# Patient Record
Sex: Female | Born: 1940
Health system: Southern US, Community
[De-identification: ages and names within clinical notes are randomized; demographics above are authoritative.]

## PROBLEM LIST (undated history)

## (undated) DIAGNOSIS — N183 Chronic kidney disease, stage 3 unspecified: Secondary | ICD-10-CM

## (undated) DIAGNOSIS — E1121 Type 2 diabetes mellitus with diabetic nephropathy: Secondary | ICD-10-CM

## (undated) DIAGNOSIS — M810 Age-related osteoporosis without current pathological fracture: Secondary | ICD-10-CM

## (undated) DIAGNOSIS — D249 Benign neoplasm of unspecified breast: Secondary | ICD-10-CM

## (undated) DIAGNOSIS — K219 Gastro-esophageal reflux disease without esophagitis: Secondary | ICD-10-CM

## (undated) DIAGNOSIS — I10 Essential (primary) hypertension: Secondary | ICD-10-CM

## (undated) DIAGNOSIS — I4891 Unspecified atrial fibrillation: Secondary | ICD-10-CM

## (undated) DIAGNOSIS — E785 Hyperlipidemia, unspecified: Secondary | ICD-10-CM

## (undated) DIAGNOSIS — M1990 Unspecified osteoarthritis, unspecified site: Secondary | ICD-10-CM

## (undated) DIAGNOSIS — E119 Type 2 diabetes mellitus without complications: Secondary | ICD-10-CM

## (undated) HISTORY — DX: Gastro-esophageal reflux disease without esophagitis: K21.9

## (undated) HISTORY — DX: Age-related osteoporosis without current pathological fracture: M81.0

## (undated) HISTORY — DX: Chronic kidney disease, stage 3 unspecified: N18.30

## (undated) HISTORY — PX: BREAST SURGERY: SHX581

## (undated) HISTORY — PX: COLONOSCOPY: SHX174

## (undated) HISTORY — DX: Type 2 diabetes mellitus with diabetic nephropathy: E11.21

## (undated) HISTORY — PX: VARICOSE VEIN SURGERY: SHX832

## (undated) HISTORY — PX: ABDOMINAL HYSTERECTOMY: SHX81

## (undated) HISTORY — DX: Unspecified atrial fibrillation: I48.91

## (undated) HISTORY — PX: APPENDECTOMY: SHX54

---

## 2002-02-17 ENCOUNTER — Inpatient Hospital Stay (HOSPITAL_COMMUNITY): Admission: AD | Admit: 2002-02-17 | Discharge: 2002-02-22 | Payer: Self-pay | Admitting: Cardiology

## 2002-02-18 ENCOUNTER — Encounter: Payer: Self-pay | Admitting: Cardiology

## 2002-02-22 ENCOUNTER — Encounter: Payer: Self-pay | Admitting: Cardiology

## 2016-04-01 ENCOUNTER — Other Ambulatory Visit: Payer: Self-pay | Admitting: Internal Medicine

## 2016-04-01 DIAGNOSIS — R9389 Abnormal findings on diagnostic imaging of other specified body structures: Secondary | ICD-10-CM

## 2016-04-01 DIAGNOSIS — N63 Unspecified lump in unspecified breast: Secondary | ICD-10-CM

## 2016-04-07 ENCOUNTER — Ambulatory Visit
Admission: RE | Admit: 2016-04-07 | Discharge: 2016-04-07 | Disposition: A | Discharge status: Home / Self Care | Payer: Medicare Other | Source: Ambulatory Visit | Attending: Internal Medicine | Admitting: Internal Medicine

## 2016-04-07 ENCOUNTER — Ambulatory Visit
Admission: RE | Admit: 2016-04-07 | Discharge: 2016-04-07 | Disposition: A | Discharge status: Home / Self Care | Payer: Self-pay | Source: Ambulatory Visit | Attending: Internal Medicine | Admitting: Internal Medicine

## 2016-04-07 DIAGNOSIS — R9389 Abnormal findings on diagnostic imaging of other specified body structures: Secondary | ICD-10-CM

## 2016-04-07 DIAGNOSIS — N63 Unspecified lump in unspecified breast: Secondary | ICD-10-CM

## 2016-04-07 MED ORDER — GADOBENATE DIMEGLUMINE 529 MG/ML IV SOLN
20.0000 mL | Freq: Once | INTRAVENOUS | Status: AC | PRN
  Administered 2016-04-07: 20 mL via INTRAVENOUS

## 2016-04-17 ENCOUNTER — Ambulatory Visit: Payer: Self-pay | Admitting: Surgery

## 2016-04-17 DIAGNOSIS — D242 Benign neoplasm of left breast: Secondary | ICD-10-CM

## 2016-04-17 NOTE — H&P (Signed)
une C. Guppy 04/17/2016 3:22 PM Location: [facility] Patient #: [id number] DOB: Nov 15, 1941 Married / Language: English / Race: White Female  History of Present Illness Marcello Moores A. Saleena Tamas MD; 04/17/2016 3:43 PM) Patient words: Patient sent at the request of Dr. Shon Hale of the [facility] for a left breast mammographic abnormality and right breast nipple discharge. The patient has experienced right breast nipple discharge for the last 3 months. It is nonbloody in nature. Mammogram and ultrasound were done which shows dilated ducts on the right and a mammographic abnormality on the left. Core biopsy of the left breast revealed papilloma. Other findings are that of fibrocystic change. No history of breast cancer. She is a previous left breast biopsies as a teenager. She has chronically inverted nipple she states.                           ADDENDUM REPORT: 04/08/2016 15:23 PATHOLOGY: Right breast: BENIGN DILATED DUCTS (ECTATIC DUCTS) WITH FOCAL CALCIFICATION, FIBROCYSTIC CHANGES WITH USUAL DUCTAL HYPERPLASIA of the lower inner, retroareolar area of the Right breast, with excision recommended. Left breast: INTRADUCTAL PAPILLOMA FORMATION WITH USUAL DUCTAL HYPERPLASIA, FIBROCYSTIC CHANGES WITH USUAL DUCTAL HYPERPLASIA AND ASSOCIATED CALCIFICATIONS of the central Left breast, with excision recommended. Findings at both sites are concordant per Dr. Nolon Nations. Pathology results were discussed with the patient by telephone. The patient reported doing well after the biopsies with tenderness at the sites. Post biopsy instructions and care were reviewed and questions were answered. The patient was encouraged to call [facility] for any additional concerns. At the request of the patient, surgical consultation has been arranged with Dr. Erroll Luna at [facility] on Apr 17, 2016. Pathology results reported by  Terie Purser, RN on 04/08/2016. Electronically Signed By: Nolon Nations M.D. On: 04/08/2016 15:23           Diagnosis 1. Breast, right, needle core biopsy, lower inner retroareolar - BENIGN DILATED DUCTS (ECTATIC DUCTS) WITH FOCAL CALCIFICATION. - FIBROCYSTIC CHANGES WITH USUAL DUCTAL HYPERPLASIA. - NO ATYPIA OR MALIGNANCY IDENTIFIED. - SEE COMMENT. 2. Breast, left, needle core biopsy, central - INTRADUCTAL PAPILLOMA FORMATION WITH USUAL DUCTAL HYPERPLASIA. - FIBROCYSTIC CHANGES WITH USUAL DUCTAL HYPERPLASIA AND ASSOCIATED CALCIFICATIONS. - SEE COMMENT.  The patient is a 75 year old female.   Other Problems Elbert Ewings, CMA; 04/17/2016 3:22 PM) Arthritis Back Pain Diabetes Mellitus Gastric Ulcer Gastroesophageal Reflux Disease High blood pressure Hypercholesterolemia Oophorectomy  Past Surgical History Elbert Ewings, CMA; 04/17/2016 3:22 PM) Appendectomy Breast Biopsy Left. multiple Hysterectomy (not due to cancer) - Partial  Diagnostic Studies History Elbert Ewings, CMA; 04/17/2016 3:22 PM) Colonoscopy 1-5 years ago Mammogram within last year Pap Smear >5 years ago  Allergies Elbert Ewings, CMA; 04/17/2016 3:23 PM) No Known Drug Allergies 04/17/2016  Medication History Elbert Ewings, CMA; 04/17/2016 3:24 PM) Meloxicam (15MG  Tablet, Oral twice a week) Active. Pioglitazone HCl (30MG  Tablet, Oral) Active. Omeprazole (40MG  Capsule DR, Oral) Active. Metoprolol Tartrate (50MG  Tablet, Oral) Active. Lisinopril (10MG  Tablet, Oral) Active. Pravastatin Sodium (40MG  Tablet, Oral) Active. TraMADol HCl (50MG  Tablet, Oral) Active. Zolpidem Tartrate (5MG  Tablet, Oral) Active. ALPRAZolam (0.5MG  Tablet, Oral) Active. Medications Reconciled  Social History Elbert Ewings, [place]; 04/17/2016 3:22 PM) No alcohol use No drug use Tobacco use Never smoker.  Family History Elbert Ewings, [place]; 04/17/2016 3:22 PM) Bleeding disorder Mother,  Sister. Cerebrovascular Accident Mother. Colon Cancer Mother, Sister. Colon Polyps Brother. Diabetes Mellitus Brother,  Sister. Heart Disease Brother, Mother, Sister. Hypertension Brother, Mother, Sister. Malignant Neoplasm Of Pancreas Sister.  Pregnancy / Birth History Elbert Ewings, CMA; 04/17/2016 3:22 PM) Age at menarche 83 years. Age of menopause 51-55 Contraceptive History Oral contraceptives. Gravida 2 Maternal age 75-20 Para 2 Regular periods     Review of Systems Elbert Ewings CMA; 04/17/2016 3:22 PM) General Not Present- Appetite Loss, Chills, Fatigue, Fever, Night Sweats, Weight Gain and Weight Loss. Skin Not Present- Change in Wart/Mole, Dryness, Hives, Jaundice, New Lesions, Non-Healing Wounds, Rash and Ulcer. HEENT Present- Wears glasses/contact lenses. Not Present- Earache, Hearing Loss, Hoarseness, Nose Bleed, Oral Ulcers, Ringing in the Ears, Seasonal Allergies, Sinus Pain, Sore Throat, Visual Disturbances and Yellow Eyes. Respiratory Present- Snoring. Not Present- Bloody sputum, Chronic Cough, Difficulty Breathing and Wheezing. Breast Present- Nipple Discharge. Not Present- Breast Mass, Breast Pain and Skin Changes. Cardiovascular Not Present- Chest Pain, Difficulty Breathing Lying Down, Leg Cramps, Palpitations, Rapid Heart Rate, Shortness of Breath and Swelling of Extremities. Gastrointestinal Not Present- Abdominal Pain, Bloating, Bloody Stool, Change in Bowel Habits, Chronic diarrhea, Constipation, Difficulty Swallowing, Excessive gas, Gets full quickly at meals, Hemorrhoids, Indigestion, Nausea, Rectal Pain and Vomiting. Female Genitourinary Not Present- Frequency, Nocturia, Painful Urination, Pelvic Pain and Urgency. Musculoskeletal Present- Back Pain and Joint Stiffness. Not Present- Joint Pain, Muscle Pain, Muscle Weakness and Swelling of Extremities. Psychiatric Not Present- Anxiety, Bipolar, Change in Sleep Pattern, Depression, Fearful and Frequent  crying. Hematology Not Present- Easy Bruising, Excessive bleeding, Gland problems, HIV and Persistent Infections.  Vitals Elbert Ewings CMA; 04/17/2016 3:24 PM) 04/17/2016 3:24 PM Weight: 213 lb Height: 66.5in Body Surface Area: 2.07 m Body Mass Index: 33.86 kg/m  Temp.: 97.32F  Pulse: 74 (Regular)  BP: 126/74 (Sitting, Left Arm, Standard)      Physical Exam (Neveyah Garzon A. Kaaliyah Kita MD; 04/17/2016 3:43 PM)  General Mental Status-Alert. General Appearance-Consistent with stated age. Hydration-Well hydrated. Voice-Normal.  Chest and Lung Exam Chest and lung exam reveals -quiet, even and easy respiratory effort with no use of accessory muscles and on auscultation, normal breath sounds, no adventitious sounds and normal vocal resonance. Inspection Chest Wall - Normal. Back - normal.  Breast Note: Right breast shows clear nipple discharge from dominant duct. There is no left nipple discharge. Post biopsy changes noted. No palpable masses in either breast.  Cardiovascular Cardiovascular examination reveals -normal heart sounds, regular rate and rhythm with no murmurs and normal pedal pulses bilaterally.  Neurologic Neurologic evaluation reveals -alert and oriented x 3 with no impairment of recent or remote memory. Mental Status-Normal.  Musculoskeletal Normal Exam - Left-Upper Extremity Strength Normal and Lower Extremity Strength Normal. Normal Exam - Right-Upper Extremity Strength Normal and Lower Extremity Strength Normal.  Lymphatic Head & Neck  General Head & Neck Lymphatics: Bilateral - Description - Normal. Axillary  General Axillary Region: Bilateral - Description - Normal. Tenderness - Non Tender.    Assessment & Plan (Anesa Fronek A. 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  Risk of lumpectomy include bleeding, infection, seroma, more surgery, use of seed/wire, wound care, cosmetic deformity and the need for other treatments, death , blood clots, death. Pt agrees to proceed.  DISCHARGE FROM RIGHT NIPPLE [phone]) Impression: Patient desires treatment of the right nipple discharge. Recommend right breast central duct excision. Risk of lumpectomy include bleeding, infection, seroma, more surgery, use of seed/wire, wound care, cosmetic  deformity nipple loss and the need for other treatments, death , blood clots, death. Pt agrees to proceed.  Current Plans You are being scheduled for surgery - Our schedulers will call you.  You should hear from our office's scheduling department within 5 working days about the location, date, and time of surgery. We try to make accommodations for patient's preferences in scheduling surgery, but sometimes the OR schedule or the surgeon's schedule prevents [place] from making those accommodations.  If you have not heard from our office [phone]) in 5 working days, call the office and ask for your surgeon's nurse.  If you have other questions about your diagnosis, plan, or surgery, call the office and ask for your surgeon's nurse.  Pt Education - Pamphlet Given - Breast Biopsy: discussed with patient and provided information. Pt Education - [facility] Breast Biopsy HCI: discussed with patient and provided information. The anatomy and the physiology was discussed. The pathophysiology and natural history of the disease was discussed. Options were discussed and recommendations were made. Technique, risks, benefits, & alternatives were discussed. Risks such as stroke, heart attack, bleeding, indection, death, and other risks discussed. Questions answered. The patient agrees to proceed.

## 2016-04-24 ENCOUNTER — Other Ambulatory Visit: Payer: Self-pay | Admitting: Surgery

## 2016-04-24 DIAGNOSIS — D242 Benign neoplasm of left breast: Secondary | ICD-10-CM

## 2016-04-25 ENCOUNTER — Encounter (HOSPITAL_BASED_OUTPATIENT_CLINIC_OR_DEPARTMENT_OTHER): Payer: Self-pay | Admitting: *Deleted

## 2016-04-28 ENCOUNTER — Encounter (HOSPITAL_BASED_OUTPATIENT_CLINIC_OR_DEPARTMENT_OTHER)
Admission: RE | Admit: 2016-04-28 | Discharge: 2016-04-28 | Disposition: A | Discharge status: Home / Self Care | Payer: Medicare Other | Source: Ambulatory Visit | Attending: Surgery | Admitting: Surgery

## 2016-04-28 ENCOUNTER — Other Ambulatory Visit: Payer: Self-pay

## 2016-04-28 DIAGNOSIS — N6011 Diffuse cystic mastopathy of right breast: Secondary | ICD-10-CM | POA: Diagnosis not present

## 2016-04-28 DIAGNOSIS — M1990 Unspecified osteoarthritis, unspecified site: Secondary | ICD-10-CM | POA: Diagnosis not present

## 2016-04-28 DIAGNOSIS — Z79899 Other long term (current) drug therapy: Secondary | ICD-10-CM | POA: Diagnosis not present

## 2016-04-28 DIAGNOSIS — N6012 Diffuse cystic mastopathy of left breast: Secondary | ICD-10-CM | POA: Diagnosis not present

## 2016-04-28 DIAGNOSIS — N6092 Unspecified benign mammary dysplasia of left breast: Secondary | ICD-10-CM | POA: Diagnosis not present

## 2016-04-28 DIAGNOSIS — E7800 Pure hypercholesterolemia, unspecified: Secondary | ICD-10-CM | POA: Diagnosis not present

## 2016-04-28 DIAGNOSIS — K219 Gastro-esophageal reflux disease without esophagitis: Secondary | ICD-10-CM | POA: Diagnosis not present

## 2016-04-28 DIAGNOSIS — I10 Essential (primary) hypertension: Secondary | ICD-10-CM | POA: Diagnosis not present

## 2016-04-28 DIAGNOSIS — D242 Benign neoplasm of left breast: Secondary | ICD-10-CM | POA: Diagnosis not present

## 2016-04-28 DIAGNOSIS — L988 Other specified disorders of the skin and subcutaneous tissue: Secondary | ICD-10-CM | POA: Diagnosis present

## 2016-04-28 DIAGNOSIS — E119 Type 2 diabetes mellitus without complications: Secondary | ICD-10-CM | POA: Diagnosis not present

## 2016-04-28 LAB — BASIC METABOLIC PANEL
ANION GAP: 5 (ref 5–15)
BUN: 13 mg/dL (ref 6–20)
CHLORIDE: 99 mmol/L — AB (ref 101–111)
CO2: 29 mmol/L (ref 22–32)
CREATININE: 0.79 mg/dL (ref 0.44–1.00)
Calcium: 9.5 mg/dL (ref 8.9–10.3)
GFR calc non Af Amer: >60 mL/min (ref >60)
Glucose, Bld: 201 mg/dL — ABNORMAL HIGH (ref 65–99)
POTASSIUM: 4.9 mmol/L (ref 3.5–5.1)
SODIUM: 133 mmol/L — AB (ref 135–145)

## 2016-04-28 NOTE — Progress Notes (Signed)
Pt given 8 oz bottle of water to drink by 9 am day of surgery. Instructed this is the only fluid to have after midnight. Pt given written instruction to same.  Pt voiced understanding

## 2016-04-29 ENCOUNTER — Ambulatory Visit
Admission: RE | Admit: 2016-04-29 | Discharge: 2016-04-29 | Disposition: A | Discharge status: Home / Self Care | Payer: Medicare Other | Source: Ambulatory Visit | Attending: Surgery | Admitting: Surgery

## 2016-04-29 DIAGNOSIS — D242 Benign neoplasm of left breast: Secondary | ICD-10-CM

## 2016-04-29 NOTE — Pre-Procedure Instructions (Signed)
EKG reviewed by Dr. Royce Macadamia and discussed with Dr. Al Corpus; pt. OK to come for surgery.

## 2016-05-01 ENCOUNTER — Ambulatory Visit (HOSPITAL_BASED_OUTPATIENT_CLINIC_OR_DEPARTMENT_OTHER): Payer: Medicare Other | Admitting: Certified Registered"

## 2016-05-01 ENCOUNTER — Encounter (HOSPITAL_BASED_OUTPATIENT_CLINIC_OR_DEPARTMENT_OTHER): Payer: Self-pay | Admitting: Certified Registered"

## 2016-05-01 ENCOUNTER — Encounter (HOSPITAL_BASED_OUTPATIENT_CLINIC_OR_DEPARTMENT_OTHER)
Admission: RE | Disposition: A | Discharge status: Home / Self Care | Payer: Self-pay | Source: Ambulatory Visit | Attending: Surgery

## 2016-05-01 ENCOUNTER — Ambulatory Visit (HOSPITAL_BASED_OUTPATIENT_CLINIC_OR_DEPARTMENT_OTHER)
Admission: RE | Admit: 2016-05-01 | Discharge: 2016-05-01 | Disposition: A | Discharge status: Home / Self Care | Payer: Medicare Other | Source: Ambulatory Visit | Attending: Surgery | Admitting: Surgery

## 2016-05-01 ENCOUNTER — Ambulatory Visit
Admission: RE | Admit: 2016-05-01 | Discharge: 2016-05-01 | Disposition: A | Discharge status: Home / Self Care | Payer: Medicare Other | Source: Ambulatory Visit | Attending: Surgery | Admitting: Surgery

## 2016-05-01 DIAGNOSIS — Z79899 Other long term (current) drug therapy: Secondary | ICD-10-CM | POA: Insufficient documentation

## 2016-05-01 DIAGNOSIS — K219 Gastro-esophageal reflux disease without esophagitis: Secondary | ICD-10-CM | POA: Insufficient documentation

## 2016-05-01 DIAGNOSIS — I10 Essential (primary) hypertension: Secondary | ICD-10-CM | POA: Insufficient documentation

## 2016-05-01 DIAGNOSIS — D242 Benign neoplasm of left breast: Secondary | ICD-10-CM | POA: Insufficient documentation

## 2016-05-01 DIAGNOSIS — N6092 Unspecified benign mammary dysplasia of left breast: Secondary | ICD-10-CM | POA: Diagnosis not present

## 2016-05-01 DIAGNOSIS — N6011 Diffuse cystic mastopathy of right breast: Secondary | ICD-10-CM | POA: Diagnosis not present

## 2016-05-01 DIAGNOSIS — N6012 Diffuse cystic mastopathy of left breast: Secondary | ICD-10-CM | POA: Insufficient documentation

## 2016-05-01 DIAGNOSIS — E7800 Pure hypercholesterolemia, unspecified: Secondary | ICD-10-CM | POA: Insufficient documentation

## 2016-05-01 DIAGNOSIS — E119 Type 2 diabetes mellitus without complications: Secondary | ICD-10-CM | POA: Insufficient documentation

## 2016-05-01 DIAGNOSIS — M1990 Unspecified osteoarthritis, unspecified site: Secondary | ICD-10-CM | POA: Insufficient documentation

## 2016-05-01 HISTORY — DX: Hyperlipidemia, unspecified: E78.5

## 2016-05-01 HISTORY — PX: BREAST DUCTAL SYSTEM EXCISION: SHX5242

## 2016-05-01 HISTORY — PX: BREAST LUMPECTOMY WITH RADIOACTIVE SEED LOCALIZATION: SHX6424

## 2016-05-01 HISTORY — DX: Unspecified osteoarthritis, unspecified site: M19.90

## 2016-05-01 HISTORY — DX: Benign neoplasm of unspecified breast: D24.9

## 2016-05-01 HISTORY — DX: Type 2 diabetes mellitus without complications: E11.9

## 2016-05-01 HISTORY — DX: Gastro-esophageal reflux disease without esophagitis: K21.9

## 2016-05-01 HISTORY — DX: Essential (primary) hypertension: I10

## 2016-05-01 LAB — GLUCOSE, CAPILLARY
GLUCOSE-CAPILLARY: 155 mg/dL — AB (ref 65–99)
Glucose-Capillary: 164 mg/dL — ABNORMAL HIGH (ref 65–99)

## 2016-05-01 SURGERY — BREAST LUMPECTOMY WITH RADIOACTIVE SEED LOCALIZATION
Anesthesia: General | Site: Breast | Laterality: Right

## 2016-05-01 MED ORDER — OXYCODONE HCL 5 MG/5ML PO SOLN: 5.0000 mg | Freq: Once | ORAL | Status: DC | PRN

## 2016-05-01 MED ORDER — GLYCOPYRROLATE 0.2 MG/ML IJ SOLN: 0.2000 mg | Freq: Once | INTRAMUSCULAR | Status: DC | PRN

## 2016-05-01 MED ORDER — FENTANYL CITRATE (PF) 100 MCG/2ML IJ SOLN
50.0000 ug | INTRAMUSCULAR | Status: AC | PRN
  Administered 2016-05-01: 25 ug via INTRAVENOUS
  Administered 2016-05-01: 50 ug via INTRAVENOUS
  Administered 2016-05-01: 25 ug via INTRAVENOUS

## 2016-05-01 MED ORDER — FENTANYL CITRATE (PF) 100 MCG/2ML IJ SOLN
25.0000 ug | INTRAMUSCULAR | Status: DC | PRN
  Administered 2016-05-01 (×4): 25 ug via INTRAVENOUS

## 2016-05-01 MED ORDER — LACTATED RINGERS IV SOLN
INTRAVENOUS | Status: DC
  Administered 2016-05-01: 13:00:00 via INTRAVENOUS
  Administered 2016-05-01: 10 mL/h via INTRAVENOUS
  Administered 2016-05-01 (×2): via INTRAVENOUS

## 2016-05-01 MED ORDER — EPHEDRINE SULFATE 50 MG/ML IJ SOLN
INTRAMUSCULAR | Status: DC | PRN
  Administered 2016-05-01 (×2): 10 mg via INTRAVENOUS

## 2016-05-01 MED ORDER — PROMETHAZINE HCL 25 MG/ML IJ SOLN
INTRAMUSCULAR | Status: AC
  Filled 2016-05-01: qty 1

## 2016-05-01 MED ORDER — MEPERIDINE HCL 25 MG/ML IJ SOLN: 6.2500 mg | INTRAMUSCULAR | Status: DC | PRN

## 2016-05-01 MED ORDER — LACTATED RINGERS IV SOLN: INTRAVENOUS | Status: DC

## 2016-05-01 MED ORDER — BUPIVACAINE-EPINEPHRINE (PF) 0.25% -1:200000 IJ SOLN
INTRAMUSCULAR | Status: DC | PRN
  Administered 2016-05-01: 20 mL

## 2016-05-01 MED ORDER — FENTANYL CITRATE (PF) 100 MCG/2ML IJ SOLN
INTRAMUSCULAR | Status: AC
  Filled 2016-05-01: qty 2

## 2016-05-01 MED ORDER — SCOPOLAMINE 1 MG/3DAYS TD PT72: 1.0000 | MEDICATED_PATCH | Freq: Once | TRANSDERMAL | Status: DC | PRN

## 2016-05-01 MED ORDER — OXYCODONE HCL 5 MG PO TABS: 5.0000 mg | ORAL_TABLET | Freq: Once | ORAL | Status: DC | PRN

## 2016-05-01 MED ORDER — MIDAZOLAM HCL 2 MG/2ML IJ SOLN: 1.0000 mg | INTRAMUSCULAR | Status: DC | PRN

## 2016-05-01 MED ORDER — ONDANSETRON HCL 4 MG/2ML IJ SOLN
INTRAMUSCULAR | Status: DC | PRN
  Administered 2016-05-01: 4 mg via INTRAVENOUS

## 2016-05-01 MED ORDER — MIDAZOLAM HCL 2 MG/2ML IJ SOLN
INTRAMUSCULAR | Status: AC
  Filled 2016-05-01: qty 2

## 2016-05-01 MED ORDER — DEXTROSE 5 % IV SOLN
3.0000 g | INTRAVENOUS | Status: AC
  Administered 2016-05-01: 2 g via INTRAVENOUS

## 2016-05-01 MED ORDER — PROMETHAZINE HCL 25 MG/ML IJ SOLN
6.2500 mg | INTRAMUSCULAR | Status: DC | PRN
  Administered 2016-05-01: 6.25 mg via INTRAVENOUS

## 2016-05-01 MED ORDER — HYDROCODONE-ACETAMINOPHEN 5-325 MG PO TABS: 1.0000 | ORAL_TABLET | Freq: Four times a day (QID) | ORAL | Status: DC | PRN

## 2016-05-01 MED ORDER — CEFAZOLIN SODIUM-DEXTROSE 2-4 GM/100ML-% IV SOLN
INTRAVENOUS | Status: AC
  Filled 2016-05-01: qty 100

## 2016-05-01 MED ORDER — BUPIVACAINE-EPINEPHRINE (PF) 0.25% -1:200000 IJ SOLN
INTRAMUSCULAR | Status: AC
Start: 2016-05-01 — End: 2016-05-01
  Filled 2016-05-01: qty 30

## 2016-05-01 MED ORDER — LIDOCAINE HCL (CARDIAC) 20 MG/ML IV SOLN
INTRAVENOUS | Status: DC | PRN
  Administered 2016-05-01: 60 mg via INTRAVENOUS

## 2016-05-01 MED ORDER — CHLORHEXIDINE GLUCONATE 4 % EX LIQD: 1.0000 "application " | Freq: Once | CUTANEOUS | Status: DC

## 2016-05-01 MED ORDER — PROPOFOL 10 MG/ML IV BOLUS
INTRAVENOUS | Status: DC | PRN
  Administered 2016-05-01: 160 mg via INTRAVENOUS
  Administered 2016-05-01: 40 mg via INTRAVENOUS

## 2016-05-01 SURGICAL SUPPLY — 56 items
APPLIER CLIP 9.375 MED OPEN (MISCELLANEOUS)
APR CLP MED 9.3 20 MLT OPN (MISCELLANEOUS)
BINDER BREAST LRG (GAUZE/BANDAGES/DRESSINGS) IMPLANT
BINDER BREAST MEDIUM (GAUZE/BANDAGES/DRESSINGS) IMPLANT
BINDER BREAST XLRG (GAUZE/BANDAGES/DRESSINGS) IMPLANT
BINDER BREAST XXLRG (GAUZE/BANDAGES/DRESSINGS) IMPLANT
BLADE SURG 15 STRL LF DISP TIS (BLADE) ×2 IMPLANT
BLADE SURG 15 STRL SS (BLADE) ×4
CANISTER SUC SOCK COL 7IN (MISCELLANEOUS) IMPLANT
CANISTER SUCT 1200ML W/VALVE (MISCELLANEOUS) ×4 IMPLANT
CHLORAPREP W/TINT 26ML (MISCELLANEOUS) ×4 IMPLANT
CLIP APPLIE 9.375 MED OPEN (MISCELLANEOUS) IMPLANT
COVER BACK TABLE 60X90IN (DRAPES) ×4 IMPLANT
COVER MAYO STAND STRL (DRAPES) ×4 IMPLANT
COVER PROBE W GEL 5X96 (DRAPES) ×4 IMPLANT
DECANTER SPIKE VIAL GLASS SM (MISCELLANEOUS) ×4 IMPLANT
DEVICE DUBIN W/COMP PLATE 8390 (MISCELLANEOUS) ×4 IMPLANT
DRAPE LAPAROSCOPIC ABDOMINAL (DRAPES) ×2 IMPLANT
DRAPE LAPAROTOMY 100X72 PEDS (DRAPES) ×2 IMPLANT
DRAPE UTILITY XL STRL (DRAPES) ×4 IMPLANT
ELECT COATED BLADE 2.86 ST (ELECTRODE) ×4 IMPLANT
ELECT REM PT RETURN 9FT ADLT (ELECTROSURGICAL) ×4
ELECTRODE REM PT RTRN 9FT ADLT (ELECTROSURGICAL) ×2 IMPLANT
GLOVE BIOGEL PI IND STRL 7.0 (GLOVE) IMPLANT
GLOVE BIOGEL PI IND STRL 8 (GLOVE) ×2 IMPLANT
GLOVE BIOGEL PI IND STRL 8.5 (GLOVE) IMPLANT
GLOVE BIOGEL PI INDICATOR 7.0 (GLOVE) ×4
GLOVE BIOGEL PI INDICATOR 8 (GLOVE) ×2
GLOVE BIOGEL PI INDICATOR 8.5 (GLOVE) ×2
GLOVE ECLIPSE 6.5 STRL STRAW (GLOVE) ×2 IMPLANT
GLOVE ECLIPSE 8.0 STRL XLNG CF (GLOVE) ×4 IMPLANT
GLOVE SURG SS PI 8.0 STRL IVOR (GLOVE) ×2 IMPLANT
GOWN STRL REUS W/ TWL LRG LVL3 (GOWN DISPOSABLE) ×4 IMPLANT
GOWN STRL REUS W/TWL LRG LVL3 (GOWN DISPOSABLE) ×8
GOWN STRL REUS W/TWL XL LVL3 (GOWN DISPOSABLE) ×2 IMPLANT
HEMOSTAT SNOW SURGICEL 2X4 (HEMOSTASIS) IMPLANT
KIT MARKER MARGIN INK (KITS) ×4 IMPLANT
LIQUID BAND (GAUZE/BANDAGES/DRESSINGS) ×4 IMPLANT
NDL HYPO 25X1 1.5 SAFETY (NEEDLE) ×2 IMPLANT
NEEDLE HYPO 25X1 1.5 SAFETY (NEEDLE) ×4 IMPLANT
NS IRRIG 1000ML POUR BTL (IV SOLUTION) ×4 IMPLANT
PACK BASIN DAY SURGERY FS (CUSTOM PROCEDURE TRAY) ×4 IMPLANT
PENCIL BUTTON HOLSTER BLD 10FT (ELECTRODE) ×4 IMPLANT
SLEEVE SCD COMPRESS KNEE MED (MISCELLANEOUS) ×4 IMPLANT
SPONGE LAP 4X18 X RAY DECT (DISPOSABLE) ×4 IMPLANT
STAPLER VISISTAT 35W (STAPLE) IMPLANT
SUT MNCRL AB 4-0 PS2 18 (SUTURE) ×4 IMPLANT
SUT MON AB 4-0 PC3 18 (SUTURE) ×4 IMPLANT
SUT SILK 2 0 SH (SUTURE) IMPLANT
SUT VICRYL 3-0 CR8 SH (SUTURE) ×4 IMPLANT
SYR CONTROL 10ML LL (SYRINGE) ×4 IMPLANT
TOWEL OR 17X24 6PK STRL BLUE (TOWEL DISPOSABLE) ×8 IMPLANT
TOWEL OR NON WOVEN STRL DISP B (DISPOSABLE) ×4 IMPLANT
TUBE CONNECTING 20'X1/4 (TUBING) ×1
TUBE CONNECTING 20X1/4 (TUBING) ×3 IMPLANT
YANKAUER SUCT BULB TIP NO VENT (SUCTIONS) ×4 IMPLANT

## 2016-05-01 NOTE — Op Note (Signed)
Preop diagnosis: Left breast complex sclerosing lesion and right breast nipple discharge  Postoperative diagnosis: Same  Procedure: Left breast seed localized partial mastectomy with right breast central duct excision  Surgeon: Erroll Luna M.D.  Anesthesia: Gen. with 0.25% Sensorcaine local with epinephrine  EBL: Minimal  Specimen: Left breast mass was seed in clip verified by x-ray and a right central duct tissue  Drains: None  Indications for procedure: Patient presents for left breast seed localized partial mastectomy for a complex sclerosing lesion diagnosed by mammography core biopsy. She also has chronic right breast nipple discharge with a negative workup. We discussed approaching cons of the central duct excision and the fact that the rate of finding a malignant lesion is relatively low. The discharge is cumbersome to her and she desires a potential treatment of this. I explained that success rates are not 100% and the circumstance. The procedure has been discussed with the patient. Alternatives to surgery have been discussed with the patient.  Risks of surgery include bleeding,  Infection,  Seroma formation, death,  and the need for further surgery.   The patient understands and wishes to proceed.  Description of procedure: The patient was met in the holding area. Neoprobe used to verify the seed location in the left breast and activity. Questions were answered. The patient was taken back to the operating room and placed supine. After induction of general anesthesia, both breasts were prepped and draped in a sterile fashion. Timeout was done. The left breast was addressed first. Neoprobe was used and the hotspot was identified and marked with a pen. Curvilinear incision was made along the lateral border of the nipple areolar complex since the lesion was in the central left breast. Dissection was carried down through the breast tissue and all tissue around the seating clip were excised  with the assistance of the neoprobe as a guide. Radiograph revealed both the seating clip to be present and this was sent to pathology. The cavity was made hemostatic with cautery. Of note the specimen was oriented with ink cath provided. Cavity was closed with 3-0 Vicryl and 4-0 Monocryl.  The right breast was addressed next. Curvilinear incision was made around the lateral portion of the nipple areolar complex. A lacrimal probe was placed in the offending duct was localized easily. Dissection was carried under the nipple until I encountered probe. Central duct tissue was excised to include the central duct system itself and the dominant duct identified by the probe. This was removed and passed off the field. Cavity was closed with 3-0 Vicryl and 4-0 Monocryl after hemostasis was achieved. Liquid adhesive applied to both incisions. A breast binder applied. All final counts are found to be correct. The patient was awoke and extubated taken recovery in satisfactory condition.

## 2016-05-01 NOTE — Interval H&P Note (Signed)
History and Physical Interval Note:  05/01/2016 11:42 AM  Marie [name]  has presented today for surgery, with the diagnosis of papilloma and nipple drainage  The various methods of treatment have been discussed with the patient and family. After consideration of risks, benefits and other options for treatment, the patient has consented to  Procedure(s): LEFT BREAST LUMPECTOMY WITH RADIOACTIVE SEED LOCALIZATION (Left) RIGHT CENTRAL DUCT EXCISION (Right) as a surgical intervention .  The patient's history has been reviewed, patient examined, no change in status, stable for surgery.  I have reviewed the patient's chart and labs.  Questions were answered to the patient's satisfaction.     Jonothan Heberle A.

## 2016-05-01 NOTE — Discharge Instructions (Signed)
[facility] °Office Phone Number [phone] ° °BREAST BIOPSY/ PARTIAL MASTECTOMY: POST OP INSTRUCTIONS ° °Always review your discharge instruction sheet given to you by the facility where your surgery was performed. ° °IF YOU HAVE DISABILITY OR FAMILY LEAVE FORMS, YOU MUST BRING THEM TO THE OFFICE FOR PROCESSING.  DO NOT GIVE THEM TO YOUR DOCTOR. ° °1. A prescription for pain medication may be given to you upon discharge.  Take your pain medication as prescribed, if needed.  If narcotic pain medicine is not needed, then you may take acetaminophen (Tylenol) or ibuprofen (Advil) as needed. °2. Take your usually prescribed medications unless otherwise directed °3. If you need a refill on your pain medication, please contact your pharmacy.  They will contact our office to request authorization.  Prescriptions will not be filled after 5pm or on week-ends. °4. You should eat very light the first 24 hours after surgery, such as soup, crackers, pudding, etc.  Resume your normal diet the day after surgery. °5. Most patients will experience some swelling and bruising in the breast.  Ice packs and a good support bra will help.  Swelling and bruising can take several days to resolve.  °6. It is common to experience some constipation if taking pain medication after surgery.  Increasing fluid intake and taking a stool softener will usually help or prevent this problem from occurring.  A mild laxative (Milk of Magnesia or Miralax) should be taken according to package directions if there are no bowel movements after 48 hours. °7. Unless discharge instructions indicate otherwise, you may remove your bandages 24-48 hours after surgery, and you may shower at that time.  You may have steri-strips (small skin tapes) in place directly over the incision.  These strips should be left on the skin for 7-10 days.  If your surgeon used skin glue on the incision, you may shower in 24 hours.  The glue will flake off over the  next 2-3 weeks.  Any sutures or staples will be removed at the office during your follow-up visit. °8. ACTIVITIES:  You may resume regular daily activities (gradually increasing) beginning the next day.  Wearing a good support bra or sports bra minimizes pain and swelling.  You may have sexual intercourse when it is comfortable. °a. You may drive when you no longer are taking prescription pain medication, you can comfortably wear a seatbelt, and you can safely maneuver your car and apply brakes. °b. RETURN TO WORK:  ______________________________________________________________________________________ °9. You should see your doctor in the office for a follow-up appointment approximately two weeks after your surgery.  Your doctor’s nurse will typically make your follow-up appointment when she calls you with your pathology report.  Expect your pathology report 2-3 business days after your surgery.  You may call to check if you do not hear from us after three days. °10. OTHER INSTRUCTIONS: _______________________________________________________________________________________________ _____________________________________________________________________________________________________________________________________ °_____________________________________________________________________________________________________________________________________ °_____________________________________________________________________________________________________________________________________ ° °WHEN TO CALL YOUR DOCTOR: °1. Fever over 101.0 °2. Nausea and/or vomiting. °3. Extreme swelling or bruising. °4. Continued bleeding from incision. °5. Increased pain, redness, or drainage from the incision. ° °The clinic staff is available to answer your questions during regular business hours.  Please don’t hesitate to call and ask to speak to one of the nurses for clinical concerns.  If you have a medical emergency, go to the nearest  emergency room or call 911.  A surgeon from [facility] is always on call at the hospital. ° °For further questions, please visit [email or web address]  ° ° ° °  Post Anesthesia Home Care Instructions ° °Activity: °Get plenty of rest for the remainder of the day. A responsible adult should stay with you for 24 hours following the procedure.  °For the next 24 hours, DO NOT: °-Drive a car °-Operate machinery °-Drink alcoholic beverages °-Take any medication unless instructed by your physician °-Make any legal decisions or sign important papers. ° °Meals: °Start with liquid foods such as gelatin or soup. Progress to regular foods as tolerated. Avoid greasy, spicy, heavy foods. If nausea and/or vomiting occur, drink only clear liquids until the nausea and/or vomiting subsides. Call your physician if vomiting continues. ° °Special Instructions/Symptoms: °Your throat may feel dry or sore from the anesthesia or the breathing tube placed in your throat during surgery. If this causes discomfort, gargle with warm salt water. The discomfort should disappear within 24 hours. ° °If you had a scopolamine patch placed behind your ear for the management of post- operative nausea and/or vomiting: ° °1. The medication in the patch is effective for 72 hours, after which it should be removed.  Wrap patch in a tissue and discard in the trash. Wash hands thoroughly with soap and water. °2. You may remove the patch earlier than 72 hours if you experience unpleasant side effects which may include dry mouth, dizziness or visual disturbances. °3. Avoid touching the patch. Wash your hands with soap and water after contact with the patch. °  ° °

## 2016-05-01 NOTE — Anesthesia Procedure Notes (Signed)
Procedure Name: LMA Insertion Date/Time: 05/01/2016 12:15 PM Performed by: Baxter Flattery Pre-anesthesia Checklist: Patient identified, Emergency Drugs available, Suction available and Patient being monitored Patient Re-evaluated:Patient Re-evaluated prior to inductionOxygen Delivery Method: Circle System Utilized Preoxygenation: Pre-oxygenation with 100% oxygen Intubation Type: IV induction Ventilation: Mask ventilation without difficulty LMA: LMA inserted LMA Size: 4.0 Number of attempts: 1 Airway Equipment and Method: Bite block Placement Confirmation: positive ETCO2 and breath sounds checked- equal and bilateral Tube secured with: Tape Dental Injury: Teeth and Oropharynx as per pre-operative assessment

## 2016-05-01 NOTE — Anesthesia Postprocedure Evaluation (Signed)
Anesthesia Post Note  Patient: Trameka C Rosenburg  Procedure(s) Performed: Procedure(s) (LRB): LEFT BREAST LUMPECTOMY WITH RADIOACTIVE SEED LOCALIZATION (Left) RIGHT CENTRAL DUCT EXCISION (Right)  Patient location during evaluation: PACU Anesthesia Type: General Level of consciousness: awake and alert Pain management: pain level controlled Vital Signs Assessment: post-procedure vital signs reviewed and stable Respiratory status: spontaneous breathing, nonlabored ventilation and respiratory function stable Cardiovascular status: blood pressure returned to baseline and stable Postop Assessment: no signs of nausea or vomiting Anesthetic complications: no    Last Vitals:  Filed Vitals:   05/01/16 1515 05/01/16 1528  BP: 126/62 147/64  Pulse: 66 61  Temp:  36.6 C  Resp: 11 16    Last Pain:  Filed Vitals:   05/01/16 1529  PainSc: 1                  Chandon Lazcano A

## 2016-05-01 NOTE — H&P (View-Only) (Signed)
une C. Zeitz 04/17/2016 3:22 PM Location: [facility] Patient #: [id number] DOB: 07/04/1941 Married / Language: English / Race: White Female  History of Present Illness Marcello Moores A. Neesha Langton MD; 04/17/2016 3:43 PM) Patient words: Patient sent at the request of Dr. Shon Hale of the [facility] for a left breast mammographic abnormality and right breast nipple discharge. The patient has experienced right breast nipple discharge for the last 3 months. It is nonbloody in nature. Mammogram and ultrasound were done which shows dilated ducts on the right and a mammographic abnormality on the left. Core biopsy of the left breast revealed papilloma. Other findings are that of fibrocystic change. No history of breast cancer. She is a previous left breast biopsies as a teenager. She has chronically inverted nipple she states.                           ADDENDUM REPORT: 04/08/2016 15:23 PATHOLOGY: Right breast: BENIGN DILATED DUCTS (ECTATIC DUCTS) WITH FOCAL CALCIFICATION, FIBROCYSTIC CHANGES WITH USUAL DUCTAL HYPERPLASIA of the lower inner, retroareolar area of the Right breast, with excision recommended. Left breast: INTRADUCTAL PAPILLOMA FORMATION WITH USUAL DUCTAL HYPERPLASIA, FIBROCYSTIC CHANGES WITH USUAL DUCTAL HYPERPLASIA AND ASSOCIATED CALCIFICATIONS of the central Left breast, with excision recommended. Findings at both sites are concordant per Dr. Nolon Nations. Pathology results were discussed with the patient by telephone. The patient reported doing well after the biopsies with tenderness at the sites. Post biopsy instructions and care were reviewed and questions were answered. The patient was encouraged to call [facility] for any additional concerns. At the request of the patient, surgical consultation has been arranged with Dr. Erroll Luna at [facility] on Apr 17, 2016. Pathology results reported by  Terie Purser, RN on 04/08/2016. Electronically Signed By: Nolon Nations M.D. On: 04/08/2016 15:23           Diagnosis 1. Breast, right, needle core biopsy, lower inner retroareolar - BENIGN DILATED DUCTS (ECTATIC DUCTS) WITH FOCAL CALCIFICATION. - FIBROCYSTIC CHANGES WITH USUAL DUCTAL HYPERPLASIA. - NO ATYPIA OR MALIGNANCY IDENTIFIED. - SEE COMMENT. 2. Breast, left, needle core biopsy, central - INTRADUCTAL PAPILLOMA FORMATION WITH USUAL DUCTAL HYPERPLASIA. - FIBROCYSTIC CHANGES WITH USUAL DUCTAL HYPERPLASIA AND ASSOCIATED CALCIFICATIONS. - SEE COMMENT.  The patient is a 75 year old female.   Other Problems Elbert Ewings, CMA; 04/17/2016 3:22 PM) Arthritis Back Pain Diabetes Mellitus Gastric Ulcer Gastroesophageal Reflux Disease High blood pressure Hypercholesterolemia Oophorectomy  Past Surgical History Elbert Ewings, CMA; 04/17/2016 3:22 PM) Appendectomy Breast Biopsy Left. multiple Hysterectomy (not due to cancer) - Partial  Diagnostic Studies History Elbert Ewings, CMA; 04/17/2016 3:22 PM) Colonoscopy 1-5 years ago Mammogram within last year Pap Smear >5 years ago  Allergies Elbert Ewings, CMA; 04/17/2016 3:23 PM) No Known Drug Allergies 04/17/2016  Medication History Elbert Ewings, CMA; 04/17/2016 3:24 PM) Meloxicam (15MG  Tablet, Oral twice a week) Active. Pioglitazone HCl (30MG  Tablet, Oral) Active. Omeprazole (40MG  Capsule DR, Oral) Active. Metoprolol Tartrate (50MG  Tablet, Oral) Active. Lisinopril (10MG  Tablet, Oral) Active. Pravastatin Sodium (40MG  Tablet, Oral) Active. TraMADol HCl (50MG  Tablet, Oral) Active. Zolpidem Tartrate (5MG  Tablet, Oral) Active. ALPRAZolam (0.5MG  Tablet, Oral) Active. Medications Reconciled  Social History Elbert Ewings, [place]; 04/17/2016 3:22 PM) No alcohol use No drug use Tobacco use Never smoker.  Family History Elbert Ewings, [place]; 04/17/2016 3:22 PM) Bleeding disorder Mother,  Sister. Cerebrovascular Accident Mother. Colon Cancer Mother, Sister. Colon Polyps Brother. Diabetes Mellitus Brother,  Sister. Heart Disease Brother, Mother, Sister. Hypertension Brother, Mother, Sister. Malignant Neoplasm Of Pancreas Sister.  Pregnancy / Birth History Elbert Ewings, CMA; 04/17/2016 3:22 PM) Age at menarche 49 years. Age of menopause 51-55 Contraceptive History Oral contraceptives. Gravida 2 Maternal age 61-20 Para 2 Regular periods     Review of Systems Elbert Ewings CMA; 04/17/2016 3:22 PM) General Not Present- Appetite Loss, Chills, Fatigue, Fever, Night Sweats, Weight Gain and Weight Loss. Skin Not Present- Change in Wart/Mole, Dryness, Hives, Jaundice, New Lesions, Non-Healing Wounds, Rash and Ulcer. HEENT Present- Wears glasses/contact lenses. Not Present- Earache, Hearing Loss, Hoarseness, Nose Bleed, Oral Ulcers, Ringing in the Ears, Seasonal Allergies, Sinus Pain, Sore Throat, Visual Disturbances and Yellow Eyes. Respiratory Present- Snoring. Not Present- Bloody sputum, Chronic Cough, Difficulty Breathing and Wheezing. Breast Present- Nipple Discharge. Not Present- Breast Mass, Breast Pain and Skin Changes. Cardiovascular Not Present- Chest Pain, Difficulty Breathing Lying Down, Leg Cramps, Palpitations, Rapid Heart Rate, Shortness of Breath and Swelling of Extremities. Gastrointestinal Not Present- Abdominal Pain, Bloating, Bloody Stool, Change in Bowel Habits, Chronic diarrhea, Constipation, Difficulty Swallowing, Excessive gas, Gets full quickly at meals, Hemorrhoids, Indigestion, Nausea, Rectal Pain and Vomiting. Female Genitourinary Not Present- Frequency, Nocturia, Painful Urination, Pelvic Pain and Urgency. Musculoskeletal Present- Back Pain and Joint Stiffness. Not Present- Joint Pain, Muscle Pain, Muscle Weakness and Swelling of Extremities. Psychiatric Not Present- Anxiety, Bipolar, Change in Sleep Pattern, Depression, Fearful and Frequent  crying. Hematology Not Present- Easy Bruising, Excessive bleeding, Gland problems, HIV and Persistent Infections.  Vitals Elbert Ewings CMA; 04/17/2016 3:24 PM) 04/17/2016 3:24 PM Weight: 213 lb Height: 66.5in Body Surface Area: 2.07 m Body Mass Index: 33.86 kg/m  Temp.: 97.52F  Pulse: 74 (Regular)  BP: 126/74 (Sitting, Left Arm, Standard)      Physical Exam (Lyla Jasek A. Chundra Sauerwein MD; 04/17/2016 3:43 PM)  General Mental Status-Alert. General Appearance-Consistent with stated age. Hydration-Well hydrated. Voice-Normal.  Chest and Lung Exam Chest and lung exam reveals -quiet, even and easy respiratory effort with no use of accessory muscles and on auscultation, normal breath sounds, no adventitious sounds and normal vocal resonance. Inspection Chest Wall - Normal. Back - normal.  Breast Note: Right breast shows clear nipple discharge from dominant duct. There is no left nipple discharge. Post biopsy changes noted. No palpable masses in either breast.  Cardiovascular Cardiovascular examination reveals -normal heart sounds, regular rate and rhythm with no murmurs and normal pedal pulses bilaterally.  Neurologic Neurologic evaluation reveals -alert and oriented x 3 with no impairment of recent or remote memory. Mental Status-Normal.  Musculoskeletal Normal Exam - Left-Upper Extremity Strength Normal and Lower Extremity Strength Normal. Normal Exam - Right-Upper Extremity Strength Normal and Lower Extremity Strength Normal.  Lymphatic Head & Neck  General Head & Neck Lymphatics: Bilateral - Description - Normal. Axillary  General Axillary Region: Bilateral - Description - Normal. Tenderness - Non Tender.    Assessment & Plan (Falesha Schommer A. 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  Risk of lumpectomy include bleeding, infection, seroma, more surgery, use of seed/wire, wound care, cosmetic deformity and the need for other treatments, death , blood clots, death. Pt agrees to proceed.  DISCHARGE FROM RIGHT NIPPLE [phone]) Impression: Patient desires treatment of the right nipple discharge. Recommend right breast central duct excision. Risk of lumpectomy include bleeding, infection, seroma, more surgery, use of seed/wire, wound care, cosmetic  deformity nipple loss and the need for other treatments, death , blood clots, death. Pt agrees to proceed.  Current Plans You are being scheduled for surgery - Our schedulers will call you.  You should hear from our office's scheduling department within 5 working days about the location, date, and time of surgery. We try to make accommodations for patient's preferences in scheduling surgery, but sometimes the OR schedule or the surgeon's schedule prevents [place] from making those accommodations.  If you have not heard from our office [phone]) in 5 working days, call the office and ask for your surgeon's nurse.  If you have other questions about your diagnosis, plan, or surgery, call the office and ask for your surgeon's nurse.  Pt Education - Pamphlet Given - Breast Biopsy: discussed with patient and provided information. Pt Education - [facility] Breast Biopsy HCI: discussed with patient and provided information. The anatomy and the physiology was discussed. The pathophysiology and natural history of the disease was discussed. Options were discussed and recommendations were made. Technique, risks, benefits, & alternatives were discussed. Risks such as stroke, heart attack, bleeding, indection, death, and other risks discussed. Questions answered. The patient agrees to proceed.

## 2016-05-01 NOTE — Anesthesia Preprocedure Evaluation (Addendum)
Anesthesia Evaluation  Patient identified by MRN, date of birth, ID band Patient awake    Reviewed: Allergy & Precautions, NPO status , Patient's Chart, lab work & pertinent test results  Airway Mallampati: I  TM Distance: >3 FB Neck ROM: Full    Dental  (+) Upper Dentures, Dental Advisory Given   Pulmonary neg pulmonary ROS,    breath sounds clear to auscultation       Cardiovascular hypertension, Pt. on medications and Pt. on home beta blockers  Rhythm:Regular Rate:Normal     Neuro/Psych negative neurological ROS  negative psych ROS   GI/Hepatic Neg liver ROS, GERD  Medicated,  Endo/Other  diabetes  Renal/GU negative Renal ROS  negative genitourinary   Musculoskeletal  (+) Arthritis ,   Abdominal   Peds negative pediatric ROS (+)  Hematology negative hematology ROS (+)   Anesthesia Other Findings   Reproductive/Obstetrics negative OB ROS                            04/2016 EKG: normal sinus rhythm.   Anesthesia Physical Anesthesia Plan  ASA: II  Anesthesia Plan: General   Post-op Pain Management:    Induction: Intravenous  Airway Management Planned: LMA  Additional Equipment:   Intra-op Plan:   Post-operative Plan: Extubation in OR  Informed Consent: I have reviewed the patients History and Physical, chart, labs and discussed the procedure including the risks, benefits and alternatives for the proposed anesthesia with the patient or authorized representative who has indicated his/her understanding and acceptance.   Dental advisory given  Plan Discussed with: CRNA  Anesthesia Plan Comments:         Anesthesia Quick Evaluation

## 2016-05-01 NOTE — Transfer of Care (Signed)
Immediate Anesthesia Transfer of Care Note  Patient: Marie [name]  Procedure(s) Performed: Procedure(s): LEFT BREAST LUMPECTOMY WITH RADIOACTIVE SEED LOCALIZATION (Left) RIGHT CENTRAL DUCT EXCISION (Right)  Patient Location: PACU  Anesthesia Type:General  Level of Consciousness: awake, alert  and oriented  Airway & Oxygen Therapy: Patient Spontanous Breathing and Patient connected to face mask oxygen  Post-op Assessment: Report given to RN and Post -op Vital signs reviewed and stable  Post vital signs: Reviewed and stable  Last Vitals:  Filed Vitals:   05/01/16 1119  BP: 140/81  Pulse: 58  Temp: 36.8 C  Resp: 16    Last Pain:  Filed Vitals:   05/01/16 1123  PainSc: 2          Complications: No apparent anesthesia complications

## 2016-05-02 ENCOUNTER — Encounter (HOSPITAL_BASED_OUTPATIENT_CLINIC_OR_DEPARTMENT_OTHER): Payer: Self-pay | Admitting: Surgery

## 2016-07-16 DIAGNOSIS — Z5181 Encounter for therapeutic drug level monitoring: Secondary | ICD-10-CM | POA: Diagnosis not present

## 2016-07-16 DIAGNOSIS — E119 Type 2 diabetes mellitus without complications: Secondary | ICD-10-CM | POA: Diagnosis not present

## 2016-07-16 DIAGNOSIS — E7800 Pure hypercholesterolemia, unspecified: Secondary | ICD-10-CM | POA: Diagnosis not present

## 2016-07-16 DIAGNOSIS — M170 Bilateral primary osteoarthritis of knee: Secondary | ICD-10-CM | POA: Diagnosis not present

## 2016-07-16 DIAGNOSIS — I10 Essential (primary) hypertension: Secondary | ICD-10-CM | POA: Diagnosis not present

## 2016-08-08 DIAGNOSIS — I10 Essential (primary) hypertension: Secondary | ICD-10-CM | POA: Diagnosis not present

## 2016-08-08 DIAGNOSIS — E1165 Type 2 diabetes mellitus with hyperglycemia: Secondary | ICD-10-CM | POA: Diagnosis not present

## 2016-08-08 DIAGNOSIS — E7800 Pure hypercholesterolemia, unspecified: Secondary | ICD-10-CM | POA: Diagnosis not present

## 2016-08-08 DIAGNOSIS — Z23 Encounter for immunization: Secondary | ICD-10-CM | POA: Diagnosis not present

## 2016-08-13 DIAGNOSIS — M1711 Unilateral primary osteoarthritis, right knee: Secondary | ICD-10-CM | POA: Diagnosis not present

## 2016-10-22 DIAGNOSIS — Z5181 Encounter for therapeutic drug level monitoring: Secondary | ICD-10-CM | POA: Diagnosis not present

## 2016-10-22 DIAGNOSIS — E7800 Pure hypercholesterolemia, unspecified: Secondary | ICD-10-CM | POA: Diagnosis not present

## 2016-10-22 DIAGNOSIS — I10 Essential (primary) hypertension: Secondary | ICD-10-CM | POA: Diagnosis not present

## 2016-10-22 DIAGNOSIS — G894 Chronic pain syndrome: Secondary | ICD-10-CM | POA: Diagnosis not present

## 2016-10-22 DIAGNOSIS — E119 Type 2 diabetes mellitus without complications: Secondary | ICD-10-CM | POA: Diagnosis not present

## 2016-12-17 DIAGNOSIS — E119 Type 2 diabetes mellitus without complications: Secondary | ICD-10-CM | POA: Diagnosis not present

## 2016-12-17 DIAGNOSIS — H5213 Myopia, bilateral: Secondary | ICD-10-CM | POA: Diagnosis not present

## 2017-01-22 DIAGNOSIS — Z1389 Encounter for screening for other disorder: Secondary | ICD-10-CM | POA: Diagnosis not present

## 2017-01-22 DIAGNOSIS — K219 Gastro-esophageal reflux disease without esophagitis: Secondary | ICD-10-CM | POA: Diagnosis not present

## 2017-01-22 DIAGNOSIS — I10 Essential (primary) hypertension: Secondary | ICD-10-CM | POA: Diagnosis not present

## 2017-01-22 DIAGNOSIS — M150 Primary generalized (osteo)arthritis: Secondary | ICD-10-CM | POA: Diagnosis not present

## 2017-01-22 DIAGNOSIS — E119 Type 2 diabetes mellitus without complications: Secondary | ICD-10-CM | POA: Diagnosis not present

## 2017-01-22 DIAGNOSIS — E559 Vitamin D deficiency, unspecified: Secondary | ICD-10-CM | POA: Diagnosis not present

## 2017-01-22 DIAGNOSIS — Z0000 Encounter for general adult medical examination without abnormal findings: Secondary | ICD-10-CM | POA: Diagnosis not present

## 2017-01-22 DIAGNOSIS — E7800 Pure hypercholesterolemia, unspecified: Secondary | ICD-10-CM | POA: Diagnosis not present

## 2017-01-22 DIAGNOSIS — Z5181 Encounter for therapeutic drug level monitoring: Secondary | ICD-10-CM | POA: Diagnosis not present

## 2017-02-20 DIAGNOSIS — K2270 Barrett's esophagus without dysplasia: Secondary | ICD-10-CM | POA: Diagnosis not present

## 2017-02-20 DIAGNOSIS — K219 Gastro-esophageal reflux disease without esophagitis: Secondary | ICD-10-CM | POA: Diagnosis not present

## 2017-02-20 DIAGNOSIS — Z86010 Personal history of colonic polyps: Secondary | ICD-10-CM | POA: Diagnosis not present

## 2017-02-24 DIAGNOSIS — K219 Gastro-esophageal reflux disease without esophagitis: Secondary | ICD-10-CM | POA: Diagnosis not present

## 2017-02-24 DIAGNOSIS — K2270 Barrett's esophagus without dysplasia: Secondary | ICD-10-CM | POA: Diagnosis not present

## 2017-02-24 DIAGNOSIS — Z86010 Personal history of colonic polyps: Secondary | ICD-10-CM | POA: Diagnosis not present

## 2017-03-27 DIAGNOSIS — M1711 Unilateral primary osteoarthritis, right knee: Secondary | ICD-10-CM | POA: Diagnosis not present

## 2017-04-01 DIAGNOSIS — M85851 Other specified disorders of bone density and structure, right thigh: Secondary | ICD-10-CM | POA: Diagnosis not present

## 2017-04-01 DIAGNOSIS — Z780 Asymptomatic menopausal state: Secondary | ICD-10-CM | POA: Diagnosis not present

## 2017-04-01 DIAGNOSIS — M8588 Other specified disorders of bone density and structure, other site: Secondary | ICD-10-CM | POA: Diagnosis not present

## 2017-04-01 DIAGNOSIS — Z1231 Encounter for screening mammogram for malignant neoplasm of breast: Secondary | ICD-10-CM | POA: Diagnosis not present

## 2017-04-21 DIAGNOSIS — M1711 Unilateral primary osteoarthritis, right knee: Secondary | ICD-10-CM | POA: Diagnosis not present

## 2017-04-29 DIAGNOSIS — Z79899 Other long term (current) drug therapy: Secondary | ICD-10-CM | POA: Diagnosis not present

## 2017-04-29 DIAGNOSIS — Z5181 Encounter for therapeutic drug level monitoring: Secondary | ICD-10-CM | POA: Diagnosis not present

## 2017-04-29 DIAGNOSIS — G894 Chronic pain syndrome: Secondary | ICD-10-CM | POA: Diagnosis not present

## 2017-04-29 DIAGNOSIS — I10 Essential (primary) hypertension: Secondary | ICD-10-CM | POA: Diagnosis not present

## 2017-04-29 DIAGNOSIS — G4700 Insomnia, unspecified: Secondary | ICD-10-CM | POA: Diagnosis not present

## 2017-05-10 DIAGNOSIS — E119 Type 2 diabetes mellitus without complications: Secondary | ICD-10-CM | POA: Insufficient documentation

## 2017-05-10 DIAGNOSIS — S92153A Displaced avulsion fracture (chip fracture) of unspecified talus, initial encounter for closed fracture: Secondary | ICD-10-CM | POA: Diagnosis not present

## 2017-05-10 DIAGNOSIS — M25551 Pain in right hip: Secondary | ICD-10-CM | POA: Diagnosis not present

## 2017-05-10 DIAGNOSIS — E118 Type 2 diabetes mellitus with unspecified complications: Secondary | ICD-10-CM | POA: Diagnosis not present

## 2017-05-10 DIAGNOSIS — Z7984 Long term (current) use of oral hypoglycemic drugs: Secondary | ICD-10-CM | POA: Diagnosis not present

## 2017-05-10 DIAGNOSIS — S72001A Fracture of unspecified part of neck of right femur, initial encounter for closed fracture: Secondary | ICD-10-CM | POA: Insufficient documentation

## 2017-05-10 DIAGNOSIS — M1990 Unspecified osteoarthritis, unspecified site: Secondary | ICD-10-CM | POA: Diagnosis not present

## 2017-05-10 DIAGNOSIS — Z7982 Long term (current) use of aspirin: Secondary | ICD-10-CM | POA: Diagnosis not present

## 2017-05-10 DIAGNOSIS — G8911 Acute pain due to trauma: Secondary | ICD-10-CM | POA: Diagnosis not present

## 2017-05-10 DIAGNOSIS — I10 Essential (primary) hypertension: Secondary | ICD-10-CM

## 2017-05-10 DIAGNOSIS — S72011D Unspecified intracapsular fracture of right femur, subsequent encounter for closed fracture with routine healing: Secondary | ICD-10-CM | POA: Diagnosis not present

## 2017-05-10 DIAGNOSIS — S72011A Unspecified intracapsular fracture of right femur, initial encounter for closed fracture: Secondary | ICD-10-CM | POA: Diagnosis not present

## 2017-05-10 DIAGNOSIS — Z794 Long term (current) use of insulin: Secondary | ICD-10-CM | POA: Diagnosis not present

## 2017-05-10 DIAGNOSIS — E785 Hyperlipidemia, unspecified: Secondary | ICD-10-CM

## 2017-05-10 DIAGNOSIS — W19XXXD Unspecified fall, subsequent encounter: Secondary | ICD-10-CM | POA: Diagnosis not present

## 2017-05-10 DIAGNOSIS — E871 Hypo-osmolality and hyponatremia: Secondary | ICD-10-CM | POA: Diagnosis not present

## 2017-05-10 DIAGNOSIS — M19031 Primary osteoarthritis, right wrist: Secondary | ICD-10-CM | POA: Diagnosis not present

## 2017-05-10 DIAGNOSIS — I700 Atherosclerosis of aorta: Secondary | ICD-10-CM | POA: Diagnosis not present

## 2017-05-10 HISTORY — DX: Hyperlipidemia, unspecified: E78.5

## 2017-05-10 HISTORY — DX: Essential (primary) hypertension: I10

## 2017-05-10 HISTORY — DX: Type 2 diabetes mellitus without complications: E11.9

## 2017-05-10 HISTORY — DX: Fracture of unspecified part of neck of right femur, initial encounter for closed fracture: S72.001A

## 2017-05-12 DIAGNOSIS — R262 Difficulty in walking, not elsewhere classified: Secondary | ICD-10-CM | POA: Diagnosis not present

## 2017-05-12 DIAGNOSIS — D649 Anemia, unspecified: Secondary | ICD-10-CM | POA: Diagnosis not present

## 2017-05-12 DIAGNOSIS — I10 Essential (primary) hypertension: Secondary | ICD-10-CM | POA: Diagnosis not present

## 2017-05-12 DIAGNOSIS — E119 Type 2 diabetes mellitus without complications: Secondary | ICD-10-CM | POA: Diagnosis not present

## 2017-05-12 DIAGNOSIS — E118 Type 2 diabetes mellitus with unspecified complications: Secondary | ICD-10-CM | POA: Diagnosis not present

## 2017-05-12 DIAGNOSIS — E785 Hyperlipidemia, unspecified: Secondary | ICD-10-CM | POA: Diagnosis not present

## 2017-05-12 DIAGNOSIS — Z4789 Encounter for other orthopedic aftercare: Secondary | ICD-10-CM | POA: Diagnosis not present

## 2017-05-12 DIAGNOSIS — E871 Hypo-osmolality and hyponatremia: Secondary | ICD-10-CM | POA: Diagnosis not present

## 2017-05-12 DIAGNOSIS — M1990 Unspecified osteoarthritis, unspecified site: Secondary | ICD-10-CM | POA: Diagnosis not present

## 2017-05-12 DIAGNOSIS — G8918 Other acute postprocedural pain: Secondary | ICD-10-CM | POA: Diagnosis not present

## 2017-05-12 DIAGNOSIS — G8911 Acute pain due to trauma: Secondary | ICD-10-CM | POA: Diagnosis not present

## 2017-05-12 DIAGNOSIS — W19XXXD Unspecified fall, subsequent encounter: Secondary | ICD-10-CM | POA: Diagnosis not present

## 2017-05-12 DIAGNOSIS — Z794 Long term (current) use of insulin: Secondary | ICD-10-CM | POA: Diagnosis not present

## 2017-05-12 DIAGNOSIS — S72001A Fracture of unspecified part of neck of right femur, initial encounter for closed fracture: Secondary | ICD-10-CM | POA: Diagnosis not present

## 2017-05-12 DIAGNOSIS — R2689 Other abnormalities of gait and mobility: Secondary | ICD-10-CM | POA: Diagnosis not present

## 2017-05-12 DIAGNOSIS — S92153A Displaced avulsion fracture (chip fracture) of unspecified talus, initial encounter for closed fracture: Secondary | ICD-10-CM | POA: Diagnosis not present

## 2017-05-12 DIAGNOSIS — S72011D Unspecified intracapsular fracture of right femur, subsequent encounter for closed fracture with routine healing: Secondary | ICD-10-CM | POA: Diagnosis not present

## 2017-05-14 DIAGNOSIS — R262 Difficulty in walking, not elsewhere classified: Secondary | ICD-10-CM | POA: Diagnosis not present

## 2017-05-14 DIAGNOSIS — S72011D Unspecified intracapsular fracture of right femur, subsequent encounter for closed fracture with routine healing: Secondary | ICD-10-CM | POA: Diagnosis not present

## 2017-05-14 DIAGNOSIS — D649 Anemia, unspecified: Secondary | ICD-10-CM | POA: Diagnosis not present

## 2017-05-14 DIAGNOSIS — G8918 Other acute postprocedural pain: Secondary | ICD-10-CM | POA: Diagnosis not present

## 2017-05-25 DIAGNOSIS — Z4789 Encounter for other orthopedic aftercare: Secondary | ICD-10-CM | POA: Diagnosis not present

## 2017-05-26 DIAGNOSIS — M25551 Pain in right hip: Secondary | ICD-10-CM | POA: Diagnosis not present

## 2017-05-26 DIAGNOSIS — S72001D Fracture of unspecified part of neck of right femur, subsequent encounter for closed fracture with routine healing: Secondary | ICD-10-CM | POA: Diagnosis not present

## 2017-05-28 DIAGNOSIS — S72001D Fracture of unspecified part of neck of right femur, subsequent encounter for closed fracture with routine healing: Secondary | ICD-10-CM | POA: Diagnosis not present

## 2017-05-28 DIAGNOSIS — M25551 Pain in right hip: Secondary | ICD-10-CM | POA: Diagnosis not present

## 2017-06-01 DIAGNOSIS — S72001D Fracture of unspecified part of neck of right femur, subsequent encounter for closed fracture with routine healing: Secondary | ICD-10-CM | POA: Diagnosis not present

## 2017-06-01 DIAGNOSIS — M25551 Pain in right hip: Secondary | ICD-10-CM | POA: Diagnosis not present

## 2017-06-04 DIAGNOSIS — S72001D Fracture of unspecified part of neck of right femur, subsequent encounter for closed fracture with routine healing: Secondary | ICD-10-CM | POA: Diagnosis not present

## 2017-06-04 DIAGNOSIS — M25551 Pain in right hip: Secondary | ICD-10-CM | POA: Diagnosis not present

## 2017-06-08 DIAGNOSIS — M25551 Pain in right hip: Secondary | ICD-10-CM | POA: Diagnosis not present

## 2017-06-08 DIAGNOSIS — S72001D Fracture of unspecified part of neck of right femur, subsequent encounter for closed fracture with routine healing: Secondary | ICD-10-CM | POA: Diagnosis not present

## 2017-06-11 DIAGNOSIS — S72001D Fracture of unspecified part of neck of right femur, subsequent encounter for closed fracture with routine healing: Secondary | ICD-10-CM | POA: Diagnosis not present

## 2017-06-11 DIAGNOSIS — M25551 Pain in right hip: Secondary | ICD-10-CM | POA: Diagnosis not present

## 2017-06-15 DIAGNOSIS — S72001D Fracture of unspecified part of neck of right femur, subsequent encounter for closed fracture with routine healing: Secondary | ICD-10-CM | POA: Diagnosis not present

## 2017-06-15 DIAGNOSIS — M25551 Pain in right hip: Secondary | ICD-10-CM | POA: Diagnosis not present

## 2017-06-18 DIAGNOSIS — M25551 Pain in right hip: Secondary | ICD-10-CM | POA: Diagnosis not present

## 2017-06-18 DIAGNOSIS — S72001D Fracture of unspecified part of neck of right femur, subsequent encounter for closed fracture with routine healing: Secondary | ICD-10-CM | POA: Diagnosis not present

## 2017-06-22 DIAGNOSIS — M25551 Pain in right hip: Secondary | ICD-10-CM | POA: Diagnosis not present

## 2017-06-22 DIAGNOSIS — S72001D Fracture of unspecified part of neck of right femur, subsequent encounter for closed fracture with routine healing: Secondary | ICD-10-CM | POA: Diagnosis not present

## 2017-06-25 DIAGNOSIS — S72001D Fracture of unspecified part of neck of right femur, subsequent encounter for closed fracture with routine healing: Secondary | ICD-10-CM | POA: Diagnosis not present

## 2017-06-25 DIAGNOSIS — M25551 Pain in right hip: Secondary | ICD-10-CM | POA: Diagnosis not present

## 2017-06-29 DIAGNOSIS — M25551 Pain in right hip: Secondary | ICD-10-CM | POA: Diagnosis not present

## 2017-06-29 DIAGNOSIS — S72001D Fracture of unspecified part of neck of right femur, subsequent encounter for closed fracture with routine healing: Secondary | ICD-10-CM | POA: Diagnosis not present

## 2017-07-06 DIAGNOSIS — M25551 Pain in right hip: Secondary | ICD-10-CM | POA: Diagnosis not present

## 2017-07-06 DIAGNOSIS — S72001D Fracture of unspecified part of neck of right femur, subsequent encounter for closed fracture with routine healing: Secondary | ICD-10-CM | POA: Diagnosis not present

## 2017-07-09 DIAGNOSIS — M25551 Pain in right hip: Secondary | ICD-10-CM | POA: Diagnosis not present

## 2017-07-09 DIAGNOSIS — S72001D Fracture of unspecified part of neck of right femur, subsequent encounter for closed fracture with routine healing: Secondary | ICD-10-CM | POA: Diagnosis not present

## 2017-08-03 DIAGNOSIS — G894 Chronic pain syndrome: Secondary | ICD-10-CM | POA: Diagnosis not present

## 2017-08-03 DIAGNOSIS — E119 Type 2 diabetes mellitus without complications: Secondary | ICD-10-CM | POA: Diagnosis not present

## 2017-08-03 DIAGNOSIS — I10 Essential (primary) hypertension: Secondary | ICD-10-CM | POA: Diagnosis not present

## 2017-08-20 DIAGNOSIS — M1711 Unilateral primary osteoarthritis, right knee: Secondary | ICD-10-CM | POA: Diagnosis not present

## 2017-11-10 DIAGNOSIS — I10 Essential (primary) hypertension: Secondary | ICD-10-CM | POA: Diagnosis not present

## 2017-11-10 DIAGNOSIS — Z23 Encounter for immunization: Secondary | ICD-10-CM | POA: Diagnosis not present

## 2017-11-10 DIAGNOSIS — M25552 Pain in left hip: Secondary | ICD-10-CM | POA: Diagnosis not present

## 2017-11-10 DIAGNOSIS — M150 Primary generalized (osteo)arthritis: Secondary | ICD-10-CM | POA: Diagnosis not present

## 2017-11-10 DIAGNOSIS — Z5181 Encounter for therapeutic drug level monitoring: Secondary | ICD-10-CM | POA: Diagnosis not present

## 2017-11-10 DIAGNOSIS — E119 Type 2 diabetes mellitus without complications: Secondary | ICD-10-CM | POA: Diagnosis not present

## 2017-11-10 DIAGNOSIS — M1612 Unilateral primary osteoarthritis, left hip: Secondary | ICD-10-CM | POA: Diagnosis not present

## 2017-11-26 DIAGNOSIS — M1711 Unilateral primary osteoarthritis, right knee: Secondary | ICD-10-CM | POA: Diagnosis not present

## 2017-12-24 DIAGNOSIS — M1711 Unilateral primary osteoarthritis, right knee: Secondary | ICD-10-CM | POA: Diagnosis not present

## 2018-01-29 ENCOUNTER — Other Ambulatory Visit: Payer: Self-pay | Admitting: Orthopedic Surgery

## 2018-02-10 ENCOUNTER — Other Ambulatory Visit: Payer: Self-pay | Admitting: Orthopedic Surgery

## 2018-02-10 NOTE — Pre-Procedure Instructions (Signed)
Marie [name]  02/10/2018      [id number] [address], [place] [address] Phone: [phone] Fax: [phone]    Your procedure is scheduled on Monday, February 22, 2018  Report to [facility] Admitting Entrance "A" at 8:00AM  Call this number if you have problems the morning of surgery:  [phone]   Remember:  Do not eat food or drink liquids after midnight.  Take these medicines the morning of surgery with A SIP OF WATER: AmLODipine (NORVASC), Metoprolol (LOPRESSOR), and Omeprazole (PRILOSEC). If needed TraMADol Veatrice Bourbon) for pain.  Follow your doctor's instruction regarding Aspirin.  7 days before surgery (Mar. 11), stop taking all Aspirins, Vitamins, Fish oils, and Herbal medications. Also stop all NSAIDS i.e. Advil, Ibuprofen, Motrin, Aleve, Anaprox, Naproxen, BC and Goody Powders.  How to Manage Your Diabetes Before and After Surgery  Why is it important to control my blood sugar before and after surgery? . Improving blood sugar levels before and after surgery helps healing and can limit problems. . A way of improving blood sugar control is eating a healthy diet by: o  Eating less sugar and carbohydrates o  Increasing activity/exercise o  Talking with your doctor about reaching your blood sugar goals . High blood sugars (greater than 180 mg/dL) can raise your risk of infections and slow your recovery, so you will need to focus on controlling your diabetes during the weeks before surgery. . Make sure that the doctor who takes care of your diabetes knows about your planned surgery including the date and location.  How do I manage my blood sugar before surgery? . Check your blood sugar at least 4 times a day, starting 2 days before surgery, to make sure that the level is not too high or low. o Check your blood sugar the morning of your surgery when you wake up and every 2 hours until you get to the Short  Stay unit. . If your blood sugar is less than 70 mg/dL, you will need to treat for low blood sugar: o Do not take insulin. o Treat a low blood sugar (less than 70 mg/dL) with  cup of clear juice (cranberry or apple), 4 glucose tablets, OR glucose gel. Recheck blood sugar in 15 minutes after treatment (to make sure it is greater than 70 mg/dL). If your blood sugar is not greater than 70 mg/dL on recheck, call [phone] o  for further instructions. . Report your blood sugar to the short stay nurse when you get to Short Stay.  . If you are admitted to the hospital after surgery: o Your blood sugar will be checked by the staff and you will probably be given insulin after surgery (instead of oral diabetes medicines) to make sure you have good blood sugar levels. o The goal for blood sugar control after surgery is 80-180 mg/dL.  WHAT DO I DO ABOUT MY DIABETES MEDICATION?  Marland Kitchen Do not take GlipiZIDE (GLUCOTROL XL) and Pioglitazone (ACTOS) the morning of surgery.  . If your CBG is greater than 220 mg/dL, call us at [phone]   Do not wear jewelry, make-up or nail polish.  Do not wear lotions, powders, perfumes, or deodorant.  Do not shave 48 hours prior to surgery.    Do not bring valuables to the hospital.  [facility] is not responsible for any belongings or valuables.  Contacts, dentures or bridgework may not be worn into surgery.  Leave your suitcase in the car.  After surgery it may be brought to your room.  For patients admitted to the hospital, discharge time will be determined by your treatment team.  Patients discharged the day of surgery will not be allowed to drive home.   Special instructions:   [facility]- Preparing For Surgery  Before surgery, you can play an important role. Because skin is not sterile, your skin needs to be as free of germs as possible. You can reduce the number of germs on your skin by washing with CHG (chlorahexidine gluconate) Soap before surgery.  CHG  is an antiseptic cleaner which kills germs and bonds with the skin to continue killing germs even after washing.  Please do not use if you have an allergy to CHG or antibacterial soaps. If your skin becomes reddened/irritated stop using the CHG.  Do not shave (including legs and underarms) for at least 48 hours prior to first CHG shower. It is OK to shave your face.  Please follow these instructions carefully.   1. Shower the NIGHT BEFORE SURGERY and the MORNING OF SURGERY with CHG.   2. If you chose to wash your hair, wash your hair first as usual with your normal shampoo.  3. After you shampoo, rinse your hair and body thoroughly to remove the shampoo.  4. Use CHG as you would any other liquid soap. You can apply CHG directly to the skin and wash gently with a scrungie or a clean washcloth.   5. Apply the CHG Soap to your body ONLY FROM THE NECK DOWN.  Do not use on open wounds or open sores. Avoid contact with your eyes, ears, mouth and genitals (private parts). Wash Face and genitals (private parts)  with your normal soap.  6. Wash thoroughly, paying special attention to the area where your surgery will be performed.  7. Thoroughly rinse your body with warm water from the neck down.  8. DO NOT shower/wash with your normal soap after using and rinsing off the CHG Soap.  9. Pat yourself dry with a CLEAN TOWEL.  10. Wear CLEAN PAJAMAS to bed the night before surgery, wear comfortable clothes the morning of surgery  11. Place CLEAN SHEETS on your bed the night of your first shower and DO NOT SLEEP WITH PETS.  Day of Surgery: Do not apply any deodorants/lotions. Please wear clean clothes to the hospital/surgery center.    Please read over the following fact sheets that you were given. Pain Booklet, Coughing and Deep Breathing, Total Joint Packet, MRSA Information and Surgical Site Infection Prevention

## 2018-02-11 ENCOUNTER — Encounter (HOSPITAL_COMMUNITY): Payer: Self-pay

## 2018-02-11 ENCOUNTER — Encounter (HOSPITAL_COMMUNITY)
Admission: RE | Admit: 2018-02-11 | Discharge: 2018-02-11 | Disposition: A | Discharge status: Home / Self Care | Payer: Medicare Other | Source: Ambulatory Visit | Attending: Orthopedic Surgery | Admitting: Orthopedic Surgery

## 2018-02-11 ENCOUNTER — Other Ambulatory Visit: Payer: Self-pay

## 2018-02-11 DIAGNOSIS — Z01812 Encounter for preprocedural laboratory examination: Secondary | ICD-10-CM | POA: Insufficient documentation

## 2018-02-11 DIAGNOSIS — Z0183 Encounter for blood typing: Secondary | ICD-10-CM | POA: Diagnosis not present

## 2018-02-11 DIAGNOSIS — M1711 Unilateral primary osteoarthritis, right knee: Secondary | ICD-10-CM | POA: Insufficient documentation

## 2018-02-11 LAB — CBC WITH DIFFERENTIAL/PLATELET
Basophils Absolute: 0.1 10*3/uL (ref 0.0–0.1)
Basophils Relative: 1 %
EOS ABS: 0.1 10*3/uL (ref 0.0–0.7)
Eosinophils Relative: 2 %
HEMATOCRIT: 40.3 % (ref 36.0–46.0)
Hemoglobin: 13.3 g/dL (ref 12.0–15.0)
LYMPHS ABS: 2 10*3/uL (ref 0.7–4.0)
Lymphocytes Relative: 27 %
MCH: 30.3 pg (ref 26.0–34.0)
MCHC: 33 g/dL (ref 30.0–36.0)
MCV: 91.8 fL (ref 78.0–100.0)
MONOS PCT: 5 %
Monocytes Absolute: 0.3 10*3/uL (ref 0.1–1.0)
NEUTROS PCT: 65 %
Neutro Abs: 4.7 10*3/uL (ref 1.7–7.7)
Platelets: 295 10*3/uL (ref 150–400)
RBC: 4.39 MIL/uL (ref 3.87–5.11)
RDW: 13.3 % (ref 11.5–15.5)
WBC: 7.2 10*3/uL (ref 4.0–10.5)

## 2018-02-11 LAB — BASIC METABOLIC PANEL
ANION GAP: 10 (ref 5–15)
BUN: 17 mg/dL (ref 6–20)
CHLORIDE: 98 mmol/L — AB (ref 101–111)
CO2: 25 mmol/L (ref 22–32)
CREATININE: 0.93 mg/dL (ref 0.44–1.00)
Calcium: 9.2 mg/dL (ref 8.9–10.3)
GFR calc non Af Amer: 59 mL/min — ABNORMAL LOW (ref >60)
Glucose, Bld: 232 mg/dL — ABNORMAL HIGH (ref 65–99)
POTASSIUM: 4.5 mmol/L (ref 3.5–5.1)
SODIUM: 133 mmol/L — AB (ref 135–145)

## 2018-02-11 LAB — TYPE AND SCREEN
ABO/RH(D): A POS
ANTIBODY SCREEN: NEGATIVE

## 2018-02-11 LAB — HEMOGLOBIN A1C
HEMOGLOBIN A1C: 6.9 % — AB (ref 4.8–5.6)
Mean Plasma Glucose: 151.33 mg/dL

## 2018-02-11 LAB — SURGICAL PCR SCREEN
MRSA, PCR: NEGATIVE
Staphylococcus aureus: NEGATIVE

## 2018-02-11 LAB — GLUCOSE, CAPILLARY: GLUCOSE-CAPILLARY: 184 mg/dL — AB (ref 65–99)

## 2018-02-11 LAB — ABO/RH: ABO/RH(D): A POS

## 2018-02-11 LAB — PROTIME-INR
INR: 0.97
PROTHROMBIN TIME: 12.8 s (ref 11.4–15.2)

## 2018-02-11 LAB — URINALYSIS, ROUTINE W REFLEX MICROSCOPIC
BILIRUBIN URINE: NEGATIVE
GLUCOSE, UA: NEGATIVE mg/dL
Hgb urine dipstick: NEGATIVE
KETONES UR: NEGATIVE mg/dL
Leukocytes, UA: NEGATIVE
NITRITE: NEGATIVE
PH: 5 (ref 5.0–8.0)
PROTEIN: NEGATIVE mg/dL
Specific Gravity, Urine: 1.012 (ref 1.005–1.030)

## 2018-02-11 LAB — APTT: aPTT: 32 seconds (ref 24–36)

## 2018-02-11 NOTE — Progress Notes (Addendum)
PCP is Dr. Vassie Loll [facility] [place] Cardiologist Denies  Reports fasting CBG's run 100-130's Denies chest pain, cough, or fever. States she had a stress test and echo around 20 years ago Denies ever having a card cath.  Requested EKG form [facility]

## 2018-02-12 DIAGNOSIS — Z1389 Encounter for screening for other disorder: Secondary | ICD-10-CM | POA: Diagnosis not present

## 2018-02-12 DIAGNOSIS — Z0000 Encounter for general adult medical examination without abnormal findings: Secondary | ICD-10-CM | POA: Diagnosis not present

## 2018-02-12 DIAGNOSIS — M810 Age-related osteoporosis without current pathological fracture: Secondary | ICD-10-CM | POA: Diagnosis not present

## 2018-02-12 DIAGNOSIS — Z5181 Encounter for therapeutic drug level monitoring: Secondary | ICD-10-CM | POA: Diagnosis not present

## 2018-02-12 DIAGNOSIS — E559 Vitamin D deficiency, unspecified: Secondary | ICD-10-CM | POA: Diagnosis not present

## 2018-02-12 DIAGNOSIS — E119 Type 2 diabetes mellitus without complications: Secondary | ICD-10-CM | POA: Diagnosis not present

## 2018-02-12 DIAGNOSIS — I10 Essential (primary) hypertension: Secondary | ICD-10-CM | POA: Diagnosis not present

## 2018-02-12 DIAGNOSIS — E7800 Pure hypercholesterolemia, unspecified: Secondary | ICD-10-CM | POA: Diagnosis not present

## 2018-02-19 DIAGNOSIS — M1711 Unilateral primary osteoarthritis, right knee: Secondary | ICD-10-CM | POA: Diagnosis present

## 2018-02-19 HISTORY — DX: Unilateral primary osteoarthritis, right knee: M17.11

## 2018-02-19 NOTE — H&P (Signed)
TOTAL KNEE ADMISSION H&P  Patient is being admitted for right total knee arthroplasty.  Subjective:  Chief Complaint:right knee pain.  HPI: Marie [name], 77 y.o. female, has a history of pain and functional disability in the right knee due to arthritis and has failed non-surgical conservative treatments for greater than 12 weeks to includeNSAID's and/or analgesics, corticosteriod injections, viscosupplementation injections, flexibility and strengthening excercises and activity modification.  Onset of symptoms was gradual, starting 5 years ago with gradually worsening course since that time. The patient noted no past surgery on the right knee(s).  Patient currently rates pain in the right knee(s) at 10 out of 10 with activity. Patient has night pain, worsening of pain with activity and weight bearing, pain that interferes with activities of daily living, pain with passive range of motion and crepitus.  Patient has evidence of joint space narrowing by imaging studies.  There is no active infection.  There are no active problems to display for this patient.  Past Medical History:  Diagnosis Date  . Arthritis    knees, hands, hips  . Diabetes mellitus without complication ([place])   . GERD (gastroesophageal reflux disease)   . Hyperlipidemia   . Hypertension   . Papilloma of breast    left    Past Surgical History:  Procedure Laterality Date  . ABDOMINAL HYSTERECTOMY    . APPENDECTOMY    . BREAST DUCTAL SYSTEM EXCISION Right 05/01/2016   Procedure: RIGHT CENTRAL DUCT EXCISION;  Surgeon: Erroll Luna, MD;  Location: [place];  Service: General;  Laterality: Right;  . BREAST LUMPECTOMY WITH RADIOACTIVE SEED LOCALIZATION Left 05/01/2016   Procedure: LEFT BREAST LUMPECTOMY WITH RADIOACTIVE SEED LOCALIZATION;  Surgeon: Erroll Luna, MD;  Location: [place];  Service: General;  Laterality: Left;  . BREAST SURGERY     left breast biopsy x2  . COLONOSCOPY    .  VARICOSE VEIN SURGERY Left     No current facility-administered medications for this encounter.    Current Outpatient Medications  Medication Sig Dispense Refill Last Dose  . amLODipine (NORVASC) 5 MG tablet Take 5 mg by mouth daily.  2   . aspirin 81 MG chewable tablet Chew 81 mg by mouth every other day. In the morning     . Calcium Carb-Cholecalciferol (CALCIUM 600+D3 PO) Take 1 tablet by mouth daily.     Marland Kitchen glipiZIDE (GLUCOTROL XL) 5 MG 24 hr tablet Take 5 mg by mouth every other day. IN THE MORNING.  3   . lisinopril (PRINIVIL,ZESTRIL) 40 MG tablet Take 40 mg by mouth daily.  3   . metoprolol (LOPRESSOR) 50 MG tablet Take 50 mg by mouth 2 (two) times daily.   05/01/2016 at 0830  . omeprazole (PRILOSEC) 40 MG capsule Take 40 mg by mouth daily.   05/01/2016 at Unknown time  . pioglitazone (ACTOS) 30 MG tablet Take 30 mg by mouth daily.   04/30/2016 at Unknown time  . pravastatin (PRAVACHOL) 40 MG tablet Take 40 mg by mouth at bedtime.    04/30/2016 at Unknown time  . traMADol (ULTRAM) 50 MG tablet Take 50 mg by mouth every 6 (six) hours as needed (for pain.).    04/30/2016 at Unknown time   No Known Allergies  Social History   Tobacco Use  . Smoking status: Never Smoker  . Smokeless tobacco: Never Used  Substance Use Topics  . Alcohol use: No    Family History  Problem Relation Age of Onset  .  High blood pressure Mother   . Colon cancer Mother   . Heart attack Father   . Colon cancer Sister   . Pancreatic cancer Sister   . High blood pressure Brother   . Bleeding Disorder Sister   . High blood pressure Brother      Review of Systems  Constitutional: Negative.   HENT: Positive for sinus pain.   Eyes:       Poor vision  Respiratory: Negative.   Cardiovascular: Positive for leg swelling.       Htn  Gastrointestinal: Positive for heartburn.       Ulcers  Genitourinary: Positive for urgency.  Musculoskeletal: Positive for joint pain.  Skin: Negative.   Neurological:        Poor balance  Endo/Heme/Allergies: Bruises/bleeds easily.  Psychiatric/Behavioral: The patient is nervous/anxious.     Objective:  Physical Exam  Constitutional: She is oriented to person, place, and time. She appears well-developed and well-nourished.  HENT:  Head: Normocephalic and atraumatic.  Eyes: Pupils are equal, round, and reactive to light.  Neck: Normal range of motion. Neck supple.  Cardiovascular: Intact distal pulses.  Respiratory: Effort normal.  Musculoskeletal: She exhibits tenderness.  Obvious valgus deformity, right greater than left knee, tender along the lateral joint line.  Range of motion 5 with an ouch, 230 also with an ouch, collateral ligaments are stable.  Valgus stress exacerbates her pain.  Varus stress ameliorates her pain.  Neurovascular intact distally.    Neurological: She is alert and oriented to person, place, and time.  Skin: Skin is warm and dry.  Psychiatric: She has a normal mood and affect. Her behavior is normal. Judgment and thought content normal.    Vital signs in last 24 hours:    Labs:   Estimated body mass index is 36.24 kg/m as calculated from the following:   Height as of 02/11/18: 5\' 6"  (1.676 m).   Weight as of 02/11/18: 101.8 kg (224 lb 8 oz).   Imaging Review Plain radiographs demonstrate  end-stage arthritis lateral compartment of the right knee, moderate arthritis of the patellofemoral compartment and moderate arthritis of the medial compartment.  She is bone-on-bone on the lateral side.  Assessment/Plan:  End stage arthritis, right knee   The patient history, physical examination, clinical judgment of the provider and imaging studies are consistent with end stage degenerative joint disease of the right knee(s) and total knee arthroplasty is deemed medically necessary. The treatment options including medical management, injection therapy arthroscopy and arthroplasty were discussed at length. The risks and benefits of total  knee arthroplasty were presented and reviewed. The risks due to aseptic loosening, infection, stiffness, patella tracking problems, thromboembolic complications and other imponderables were discussed. The patient acknowledged the explanation, agreed to proceed with the plan and consent was signed. Patient is being admitted for inpatient treatment for surgery, pain control, PT, OT, prophylactic antibiotics, VTE prophylaxis, progressive ambulation and ADL's and discharge planning. The patient is planning to be discharged home with home health services.

## 2018-02-19 NOTE — Progress Notes (Signed)
Attempted to obtain copy of EKG tracing from [facility].  HPR states that we must request from [place] states we must get from [facility].  Unable to obtain tracing,will do EKG day of surgery.

## 2018-02-20 MED ORDER — BUPIVACAINE LIPOSOME 1.3 % IJ SUSP
20.0000 mL | Freq: Once | INTRAMUSCULAR | Status: DC
  Filled 2018-02-20: qty 20

## 2018-02-20 MED ORDER — TRANEXAMIC ACID 1000 MG/10ML IV SOLN
1000.0000 mg | INTRAVENOUS | Status: AC
  Filled 2018-02-20: qty 10

## 2018-02-20 MED ORDER — TRANEXAMIC ACID 1000 MG/10ML IV SOLN
2000.0000 mg | INTRAVENOUS | Status: AC
  Filled 2018-02-20: qty 20

## 2018-02-21 MED ORDER — TRANEXAMIC ACID 1000 MG/10ML IV SOLN
1000.0000 mg | INTRAVENOUS | Status: AC
  Administered 2018-02-22: 1000 mg via INTRAVENOUS
  Filled 2018-02-21: qty 1100

## 2018-02-21 MED ORDER — TRANEXAMIC ACID 1000 MG/10ML IV SOLN
2000.0000 mg | INTRAVENOUS | Status: DC
  Filled 2018-02-21: qty 20

## 2018-02-22 ENCOUNTER — Ambulatory Visit (HOSPITAL_COMMUNITY): Payer: Medicare Other | Admitting: Emergency Medicine

## 2018-02-22 ENCOUNTER — Inpatient Hospital Stay (HOSPITAL_COMMUNITY)
Admission: AD | Admit: 2018-02-22 | Discharge: 2018-02-24 | DRG: 470 | Disposition: A | Discharge status: Home / Self Care | Payer: Medicare Other | Source: Ambulatory Visit | Attending: Orthopedic Surgery | Admitting: Orthopedic Surgery

## 2018-02-22 ENCOUNTER — Encounter (HOSPITAL_COMMUNITY): Payer: Self-pay | Admitting: General Practice

## 2018-02-22 ENCOUNTER — Other Ambulatory Visit: Payer: Self-pay

## 2018-02-22 ENCOUNTER — Ambulatory Visit (HOSPITAL_COMMUNITY): Payer: Medicare Other | Admitting: Certified Registered"

## 2018-02-22 ENCOUNTER — Encounter (HOSPITAL_COMMUNITY)
Admission: AD | Disposition: A | Discharge status: Home / Self Care | Payer: Self-pay | Source: Ambulatory Visit | Attending: Orthopedic Surgery

## 2018-02-22 DIAGNOSIS — D62 Acute posthemorrhagic anemia: Secondary | ICD-10-CM | POA: Diagnosis not present

## 2018-02-22 DIAGNOSIS — E785 Hyperlipidemia, unspecified: Secondary | ICD-10-CM | POA: Diagnosis present

## 2018-02-22 DIAGNOSIS — Z832 Family history of diseases of the blood and blood-forming organs and certain disorders involving the immune mechanism: Secondary | ICD-10-CM

## 2018-02-22 DIAGNOSIS — K219 Gastro-esophageal reflux disease without esophagitis: Secondary | ICD-10-CM | POA: Diagnosis present

## 2018-02-22 DIAGNOSIS — Z8249 Family history of ischemic heart disease and other diseases of the circulatory system: Secondary | ICD-10-CM

## 2018-02-22 DIAGNOSIS — Z7982 Long term (current) use of aspirin: Secondary | ICD-10-CM

## 2018-02-22 DIAGNOSIS — M25561 Pain in right knee: Secondary | ICD-10-CM | POA: Diagnosis present

## 2018-02-22 DIAGNOSIS — I10 Essential (primary) hypertension: Secondary | ICD-10-CM | POA: Diagnosis present

## 2018-02-22 DIAGNOSIS — Z800 Family history of malignant neoplasm of digestive organs: Secondary | ICD-10-CM | POA: Diagnosis not present

## 2018-02-22 DIAGNOSIS — Z7984 Long term (current) use of oral hypoglycemic drugs: Secondary | ICD-10-CM

## 2018-02-22 DIAGNOSIS — Z90710 Acquired absence of both cervix and uterus: Secondary | ICD-10-CM

## 2018-02-22 DIAGNOSIS — E119 Type 2 diabetes mellitus without complications: Secondary | ICD-10-CM | POA: Diagnosis not present

## 2018-02-22 DIAGNOSIS — G8918 Other acute postprocedural pain: Secondary | ICD-10-CM | POA: Diagnosis not present

## 2018-02-22 DIAGNOSIS — M1711 Unilateral primary osteoarthritis, right knee: Principal | ICD-10-CM | POA: Diagnosis present

## 2018-02-22 HISTORY — PX: TOTAL KNEE ARTHROPLASTY: SHX125

## 2018-02-22 HISTORY — DX: Unilateral primary osteoarthritis, right knee: M17.11

## 2018-02-22 LAB — HEMOGLOBIN A1C
Hgb A1c MFr Bld: 6.9 % — ABNORMAL HIGH (ref 4.8–5.6)
MEAN PLASMA GLUCOSE: 151.33 mg/dL

## 2018-02-22 LAB — GLUCOSE, CAPILLARY
GLUCOSE-CAPILLARY: 170 mg/dL — AB (ref 65–99)
GLUCOSE-CAPILLARY: 144 mg/dL — AB (ref 65–99)
Glucose-Capillary: 217 mg/dL — ABNORMAL HIGH (ref 65–99)

## 2018-02-22 SURGERY — ARTHROPLASTY, KNEE, TOTAL
Anesthesia: Spinal | Laterality: Right

## 2018-02-22 MED ORDER — ROPIVACAINE HCL 7.5 MG/ML IJ SOLN
INTRAMUSCULAR | Status: DC | PRN
  Administered 2018-02-22: 20 mL via PERINEURAL

## 2018-02-22 MED ORDER — ONDANSETRON HCL 4 MG/2ML IJ SOLN
4.0000 mg | Freq: Four times a day (QID) | INTRAMUSCULAR | Status: DC | PRN
  Administered 2018-02-22 – 2018-02-23 (×2): 4 mg via INTRAVENOUS
  Filled 2018-02-22 (×2): qty 2

## 2018-02-22 MED ORDER — MIDAZOLAM HCL 2 MG/2ML IJ SOLN
INTRAMUSCULAR | Status: AC
  Administered 2018-02-22: 2 mg via INTRAVENOUS
  Filled 2018-02-22: qty 2

## 2018-02-22 MED ORDER — OXYCODONE HCL 5 MG PO TABS
5.0000 mg | ORAL_TABLET | ORAL | Status: DC | PRN
  Administered 2018-02-22 – 2018-02-24 (×10): 10 mg via ORAL
  Filled 2018-02-22 (×10): qty 2

## 2018-02-22 MED ORDER — ASPIRIN EC 325 MG PO TBEC: 325.0000 mg | DELAYED_RELEASE_TABLET | Freq: Two times a day (BID) | ORAL | 0 refills | Status: DC

## 2018-02-22 MED ORDER — PHENYLEPHRINE HCL 10 MG/ML IJ SOLN
INTRAMUSCULAR | Status: DC | PRN
  Administered 2018-02-22: 30 ug/min via INTRAVENOUS

## 2018-02-22 MED ORDER — PHENOL 1.4 % MT LIQD: 1.0000 | OROMUCOSAL | Status: DC | PRN

## 2018-02-22 MED ORDER — CHLORHEXIDINE GLUCONATE 4 % EX LIQD: 60.0000 mL | Freq: Once | CUTANEOUS | Status: DC

## 2018-02-22 MED ORDER — HYDROMORPHONE HCL 1 MG/ML IJ SOLN
0.5000 mg | INTRAMUSCULAR | Status: DC | PRN
  Administered 2018-02-22: 1 mg via INTRAVENOUS
  Administered 2018-02-22: 0.5 mg via INTRAVENOUS
  Administered 2018-02-22: 1 mg via INTRAVENOUS
  Filled 2018-02-22 (×3): qty 1

## 2018-02-22 MED ORDER — KCL IN DEXTROSE-NACL 20-5-0.45 MEQ/L-%-% IV SOLN
INTRAVENOUS | Status: DC
  Administered 2018-02-22 (×2): 100 mL/h via INTRAVENOUS
  Administered 2018-02-22: 22:00:00 via INTRAVENOUS
  Filled 2018-02-22 (×2): qty 1000

## 2018-02-22 MED ORDER — AMLODIPINE BESYLATE 10 MG PO TABS
10.0000 mg | ORAL_TABLET | Freq: Every day | ORAL | Status: DC
  Administered 2018-02-23 – 2018-02-24 (×2): 10 mg via ORAL
  Filled 2018-02-22 (×2): qty 1

## 2018-02-22 MED ORDER — GABAPENTIN 300 MG PO CAPS
300.0000 mg | ORAL_CAPSULE | Freq: Three times a day (TID) | ORAL | Status: DC
  Administered 2018-02-22 – 2018-02-24 (×6): 300 mg via ORAL
  Filled 2018-02-22 (×6): qty 1

## 2018-02-22 MED ORDER — BUPIVACAINE LIPOSOME 1.3 % IJ SUSP
INTRAMUSCULAR | Status: DC | PRN
  Administered 2018-02-22: 20 mL

## 2018-02-22 MED ORDER — BUPIVACAINE IN DEXTROSE 0.75-8.25 % IT SOLN
INTRATHECAL | Status: DC | PRN
  Administered 2018-02-22: 2 mL via INTRATHECAL

## 2018-02-22 MED ORDER — ONDANSETRON HCL 4 MG/2ML IJ SOLN
INTRAMUSCULAR | Status: AC
  Filled 2018-02-22: qty 2

## 2018-02-22 MED ORDER — ASPIRIN EC 325 MG PO TBEC
325.0000 mg | DELAYED_RELEASE_TABLET | Freq: Every day | ORAL | Status: DC
  Administered 2018-02-23 – 2018-02-24 (×2): 325 mg via ORAL
  Filled 2018-02-22 (×2): qty 1

## 2018-02-22 MED ORDER — HYDROMORPHONE HCL 1 MG/ML IJ SOLN: 0.2500 mg | INTRAMUSCULAR | Status: DC | PRN

## 2018-02-22 MED ORDER — METOPROLOL TARTRATE 50 MG PO TABS
50.0000 mg | ORAL_TABLET | Freq: Two times a day (BID) | ORAL | Status: DC
Start: 2018-02-22 — End: 2018-02-24
  Administered 2018-02-22 – 2018-02-24 (×4): 50 mg via ORAL
  Filled 2018-02-22 (×4): qty 1

## 2018-02-22 MED ORDER — LIDOCAINE 2% (20 MG/ML) 5 ML SYRINGE
INTRAMUSCULAR | Status: DC | PRN
  Administered 2018-02-22: 2 mg via INTRAVENOUS

## 2018-02-22 MED ORDER — BISACODYL 5 MG PO TBEC
5.0000 mg | DELAYED_RELEASE_TABLET | Freq: Every day | ORAL | Status: DC | PRN
  Administered 2018-02-23: 5 mg via ORAL
  Filled 2018-02-22: qty 1

## 2018-02-22 MED ORDER — BUPIVACAINE-EPINEPHRINE 0.25% -1:200000 IJ SOLN
INTRAMUSCULAR | Status: AC
  Filled 2018-02-22: qty 1

## 2018-02-22 MED ORDER — 0.9 % SODIUM CHLORIDE (POUR BTL) OPTIME
TOPICAL | Status: DC | PRN
  Administered 2018-02-22: 1000 mL

## 2018-02-22 MED ORDER — GLIPIZIDE ER 5 MG PO TB24
5.0000 mg | ORAL_TABLET | ORAL | Status: DC
  Filled 2018-02-22: qty 1

## 2018-02-22 MED ORDER — SODIUM CHLORIDE 0.9 % IR SOLN
Status: DC | PRN
  Administered 2018-02-22: 3000 mL

## 2018-02-22 MED ORDER — FENTANYL CITRATE (PF) 250 MCG/5ML IJ SOLN
INTRAMUSCULAR | Status: AC
  Filled 2018-02-22: qty 5

## 2018-02-22 MED ORDER — SODIUM CHLORIDE 0.9 % IJ SOLN
INTRAMUSCULAR | Status: DC | PRN
  Administered 2018-02-22 (×2): 10 mL

## 2018-02-22 MED ORDER — CEFAZOLIN SODIUM-DEXTROSE 2-4 GM/100ML-% IV SOLN
2.0000 g | INTRAVENOUS | Status: AC
  Administered 2018-02-22: 2 g via INTRAVENOUS
  Filled 2018-02-22: qty 100

## 2018-02-22 MED ORDER — MEPERIDINE HCL 50 MG/ML IJ SOLN: 6.2500 mg | INTRAMUSCULAR | Status: DC | PRN

## 2018-02-22 MED ORDER — ARTIFICIAL TEARS OPHTHALMIC OINT
TOPICAL_OINTMENT | OPHTHALMIC | Status: AC
Start: 2018-02-22 — End: ?
  Filled 2018-02-22: qty 3.5

## 2018-02-22 MED ORDER — SODIUM CHLORIDE 0.9 % IV SOLN
1000.0000 mg | Freq: Once | INTRAVENOUS | Status: AC
  Administered 2018-02-22: 1000 mg via INTRAVENOUS
  Filled 2018-02-22: qty 10

## 2018-02-22 MED ORDER — INSULIN ASPART 100 UNIT/ML ~~LOC~~ SOLN
0.0000 [IU] | Freq: Three times a day (TID) | SUBCUTANEOUS | Status: DC
  Administered 2018-02-23 – 2018-02-24 (×4): 3 [IU] via SUBCUTANEOUS

## 2018-02-22 MED ORDER — SENNOSIDES-DOCUSATE SODIUM 8.6-50 MG PO TABS: 1.0000 | ORAL_TABLET | Freq: Every evening | ORAL | Status: DC | PRN

## 2018-02-22 MED ORDER — METHOCARBAMOL 1000 MG/10ML IJ SOLN
500.0000 mg | Freq: Four times a day (QID) | INTRAVENOUS | Status: DC | PRN
  Filled 2018-02-22: qty 5

## 2018-02-22 MED ORDER — METOCLOPRAMIDE HCL 5 MG/ML IJ SOLN: 5.0000 mg | Freq: Three times a day (TID) | INTRAMUSCULAR | Status: DC | PRN

## 2018-02-22 MED ORDER — ACETAMINOPHEN 325 MG PO TABS
325.0000 mg | ORAL_TABLET | Freq: Four times a day (QID) | ORAL | Status: DC | PRN
  Administered 2018-02-24: 650 mg via ORAL
  Filled 2018-02-22: qty 2

## 2018-02-22 MED ORDER — MENTHOL 3 MG MT LOZG: 1.0000 | LOZENGE | OROMUCOSAL | Status: DC | PRN

## 2018-02-22 MED ORDER — DOCUSATE SODIUM 100 MG PO CAPS
100.0000 mg | ORAL_CAPSULE | Freq: Two times a day (BID) | ORAL | Status: DC
  Administered 2018-02-22 – 2018-02-24 (×5): 100 mg via ORAL
  Filled 2018-02-22 (×5): qty 1

## 2018-02-22 MED ORDER — DIPHENHYDRAMINE HCL 12.5 MG/5ML PO ELIX: 12.5000 mg | ORAL_SOLUTION | ORAL | Status: DC | PRN

## 2018-02-22 MED ORDER — ZOLPIDEM TARTRATE 5 MG PO TABS: 5.0000 mg | ORAL_TABLET | Freq: Every evening | ORAL | Status: DC | PRN

## 2018-02-22 MED ORDER — PRAVASTATIN SODIUM 40 MG PO TABS
40.0000 mg | ORAL_TABLET | Freq: Every day | ORAL | Status: DC
  Administered 2018-02-22 – 2018-02-23 (×2): 40 mg via ORAL
  Filled 2018-02-22 (×2): qty 1

## 2018-02-22 MED ORDER — FENTANYL CITRATE (PF) 100 MCG/2ML IJ SOLN
INTRAMUSCULAR | Status: AC
  Administered 2018-02-22: 50 ug via INTRAVENOUS
  Filled 2018-02-22: qty 2

## 2018-02-22 MED ORDER — ALUM & MAG HYDROXIDE-SIMETH 200-200-20 MG/5ML PO SUSP: 30.0000 mL | ORAL | Status: DC | PRN

## 2018-02-22 MED ORDER — OXYCODONE-ACETAMINOPHEN 5-325 MG PO TABS: 1.0000 | ORAL_TABLET | ORAL | 0 refills | Status: DC | PRN

## 2018-02-22 MED ORDER — BUPIVACAINE-EPINEPHRINE (PF) 0.25% -1:200000 IJ SOLN
INTRAMUSCULAR | Status: DC | PRN
  Administered 2018-02-22: 50 mL via PERINEURAL

## 2018-02-22 MED ORDER — ACETAMINOPHEN 500 MG PO TABS
1000.0000 mg | ORAL_TABLET | Freq: Four times a day (QID) | ORAL | Status: AC
  Administered 2018-02-22 – 2018-02-23 (×4): 1000 mg via ORAL
  Filled 2018-02-22 (×4): qty 2

## 2018-02-22 MED ORDER — ONDANSETRON HCL 4 MG/2ML IJ SOLN
INTRAMUSCULAR | Status: DC | PRN
  Administered 2018-02-22: 4 mg via INTRAVENOUS

## 2018-02-22 MED ORDER — FLEET ENEMA 7-19 GM/118ML RE ENEM: 1.0000 | ENEMA | Freq: Once | RECTAL | Status: DC | PRN

## 2018-02-22 MED ORDER — PIOGLITAZONE HCL 30 MG PO TABS
30.0000 mg | ORAL_TABLET | Freq: Every day | ORAL | Status: DC
  Administered 2018-02-22 – 2018-02-23 (×2): 30 mg via ORAL
  Filled 2018-02-22 (×3): qty 1

## 2018-02-22 MED ORDER — FENTANYL CITRATE (PF) 100 MCG/2ML IJ SOLN
50.0000 ug | Freq: Once | INTRAMUSCULAR | Status: AC
  Administered 2018-02-22: 50 ug via INTRAVENOUS

## 2018-02-22 MED ORDER — PROPOFOL 500 MG/50ML IV EMUL
INTRAVENOUS | Status: DC | PRN
  Administered 2018-02-22: 100 ug/kg/min via INTRAVENOUS

## 2018-02-22 MED ORDER — CELECOXIB 200 MG PO CAPS
200.0000 mg | ORAL_CAPSULE | Freq: Two times a day (BID) | ORAL | Status: DC
  Administered 2018-02-22 – 2018-02-24 (×5): 200 mg via ORAL
  Filled 2018-02-22 (×5): qty 1

## 2018-02-22 MED ORDER — ONDANSETRON HCL 4 MG/2ML IJ SOLN: 4.0000 mg | Freq: Once | INTRAMUSCULAR | Status: DC | PRN

## 2018-02-22 MED ORDER — TRANEXAMIC ACID 1000 MG/10ML IV SOLN
INTRAVENOUS | Status: AC | PRN
  Administered 2018-02-22: 2000 mg via TOPICAL

## 2018-02-22 MED ORDER — LACTATED RINGERS IV SOLN
INTRAVENOUS | Status: DC
  Administered 2018-02-22: 09:00:00 via INTRAVENOUS

## 2018-02-22 MED ORDER — METOCLOPRAMIDE HCL 5 MG PO TABS: 5.0000 mg | ORAL_TABLET | Freq: Three times a day (TID) | ORAL | Status: DC | PRN

## 2018-02-22 MED ORDER — PANTOPRAZOLE SODIUM 40 MG PO TBEC
80.0000 mg | DELAYED_RELEASE_TABLET | Freq: Every day | ORAL | Status: DC
  Administered 2018-02-23 – 2018-02-24 (×2): 80 mg via ORAL
  Filled 2018-02-22 (×2): qty 2

## 2018-02-22 MED ORDER — METHOCARBAMOL 500 MG PO TABS
500.0000 mg | ORAL_TABLET | Freq: Four times a day (QID) | ORAL | Status: DC | PRN
  Administered 2018-02-22 – 2018-02-24 (×5): 500 mg via ORAL
  Filled 2018-02-22 (×6): qty 1

## 2018-02-22 MED ORDER — MIDAZOLAM HCL 2 MG/2ML IJ SOLN
2.0000 mg | Freq: Once | INTRAMUSCULAR | Status: AC
  Administered 2018-02-22: 2 mg via INTRAVENOUS

## 2018-02-22 MED ORDER — LISINOPRIL 40 MG PO TABS
40.0000 mg | ORAL_TABLET | Freq: Every day | ORAL | Status: DC
  Administered 2018-02-23 – 2018-02-24 (×2): 40 mg via ORAL
  Filled 2018-02-22 (×2): qty 1

## 2018-02-22 MED ORDER — TIZANIDINE HCL 2 MG PO TABS: 2.0000 mg | ORAL_TABLET | Freq: Four times a day (QID) | ORAL | 0 refills | Status: DC | PRN

## 2018-02-22 MED ORDER — ONDANSETRON HCL 4 MG PO TABS: 4.0000 mg | ORAL_TABLET | Freq: Four times a day (QID) | ORAL | Status: DC | PRN

## 2018-02-22 SURGICAL SUPPLY — 55 items
BAG DECANTER FOR FLEXI CONT (MISCELLANEOUS) ×2 IMPLANT
BANDAGE ESMARK 6X9 LF (GAUZE/BANDAGES/DRESSINGS) ×1 IMPLANT
BLADE SAG 18X100X1.27 (BLADE) ×3 IMPLANT
BLADE SAGITTAL 13X1.27X60 (BLADE) IMPLANT
BLADE SAGITTAL 13X1.27X60MM (BLADE)
BLADE SAW SGTL 13X75X1.27 (BLADE) ×2 IMPLANT
BNDG CMPR 9X6 STRL LF SNTH (GAUZE/BANDAGES/DRESSINGS) ×1
BNDG CMPR MED 10X6 ELC LF (GAUZE/BANDAGES/DRESSINGS) ×1
BNDG ELASTIC 6X10 VLCR STRL LF (GAUZE/BANDAGES/DRESSINGS) ×3 IMPLANT
BNDG ESMARK 6X9 LF (GAUZE/BANDAGES/DRESSINGS) ×3
BOWL SMART MIX CTS (DISPOSABLE) ×3 IMPLANT
CAPT KNEE TOTAL 3 ATTUNE ×2 IMPLANT
CEMENT HV SMART SET (Cement) ×6 IMPLANT
COVER SURGICAL LIGHT HANDLE (MISCELLANEOUS) ×3 IMPLANT
CUFF TOURNIQUET SINGLE 34IN LL (TOURNIQUET CUFF) ×3 IMPLANT
CUFF TOURNIQUET SINGLE 44IN (TOURNIQUET CUFF) IMPLANT
DRAPE EXTREMITY T 121X128X90 (DRAPE) ×3 IMPLANT
DRAPE U-SHAPE 47X51 STRL (DRAPES) ×3 IMPLANT
DRSG AQUACEL AG ADV 3.5X10 (GAUZE/BANDAGES/DRESSINGS) ×3 IMPLANT
DURAPREP 26ML APPLICATOR (WOUND CARE) ×3 IMPLANT
ELECT REM PT RETURN 9FT ADLT (ELECTROSURGICAL) ×3
ELECTRODE REM PT RTRN 9FT ADLT (ELECTROSURGICAL) ×1 IMPLANT
FLUID NSS /IRRIG 3000 ML XXX (IV SOLUTION) ×2 IMPLANT
GLOVE BIO SURGEON STRL SZ7.5 (GLOVE) ×3 IMPLANT
GLOVE BIO SURGEON STRL SZ8.5 (GLOVE) ×3 IMPLANT
GLOVE BIOGEL PI IND STRL 8 (GLOVE) ×1 IMPLANT
GLOVE BIOGEL PI IND STRL 9 (GLOVE) ×1 IMPLANT
GLOVE BIOGEL PI INDICATOR 8 (GLOVE) ×2
GLOVE BIOGEL PI INDICATOR 9 (GLOVE) ×2
GOWN STRL REUS W/ TWL LRG LVL3 (GOWN DISPOSABLE) ×1 IMPLANT
GOWN STRL REUS W/ TWL XL LVL3 (GOWN DISPOSABLE) ×2 IMPLANT
GOWN STRL REUS W/TWL LRG LVL3 (GOWN DISPOSABLE) ×6
GOWN STRL REUS W/TWL XL LVL3 (GOWN DISPOSABLE) ×6
HANDPIECE INTERPULSE COAX TIP (DISPOSABLE) ×3
HOOD PEEL AWAY FACE SHEILD DIS (HOOD) ×6 IMPLANT
KIT BASIN OR (CUSTOM PROCEDURE TRAY) ×3 IMPLANT
KIT ROOM TURNOVER OR (KITS) ×3 IMPLANT
MANIFOLD NEPTUNE II (INSTRUMENTS) ×3 IMPLANT
NEEDLE 22X1 1/2 (OR ONLY) (NEEDLE) ×6 IMPLANT
NS IRRIG 1000ML POUR BTL (IV SOLUTION) ×3 IMPLANT
PACK TOTAL JOINT (CUSTOM PROCEDURE TRAY) ×3 IMPLANT
PAD ARMBOARD 7.5X6 YLW CONV (MISCELLANEOUS) ×6 IMPLANT
SET HNDPC FAN SPRY TIP SCT (DISPOSABLE) ×1 IMPLANT
SUT VIC AB 0 CT1 27 (SUTURE) ×3
SUT VIC AB 0 CT1 27XBRD ANBCTR (SUTURE) ×1 IMPLANT
SUT VIC AB 1 CTX 36 (SUTURE) ×3
SUT VIC AB 1 CTX36XBRD ANBCTR (SUTURE) ×1 IMPLANT
SUT VIC AB 2-0 CT1 27 (SUTURE) ×3
SUT VIC AB 2-0 CT1 TAPERPNT 27 (SUTURE) ×1 IMPLANT
SUT VIC AB 3-0 CT1 27 (SUTURE) ×3
SUT VIC AB 3-0 CT1 TAPERPNT 27 (SUTURE) ×1 IMPLANT
SYR CONTROL 10ML LL (SYRINGE) ×6 IMPLANT
TOWEL OR 17X24 6PK STRL BLUE (TOWEL DISPOSABLE) ×3 IMPLANT
TOWEL OR 17X26 10 PK STRL BLUE (TOWEL DISPOSABLE) ×3 IMPLANT
TRAY CATH 16FR W/PLASTIC CATH (SET/KITS/TRAYS/PACK) ×2 IMPLANT

## 2018-02-22 NOTE — Op Note (Signed)
PATIENT ID:      Marie [name]  MRN:     [id number] DOB/AGE:    1941-05-26 / 77 y.o.       OPERATIVE REPORT    DATE OF PROCEDURE:  02/22/2018       PREOPERATIVE DIAGNOSIS:   RIGHT KNEE OSTEOARTHRITIS      Estimated body mass index is 36.15 kg/m as calculated from the following:   Height as of this encounter: 5\' 6"  (1.676 m).   Weight as of this encounter: 224 lb (101.6 kg).                                                        POSTOPERATIVE DIAGNOSIS:   RIGHT KNEE OSTEOARTHRITIS                                                                      PROCEDURE:  Procedure(s): TOTAL KNEE ARTHROPLASTY Using DepuyAttune RP implants #6R Femur, #6Tibia, 5 mm Attune RP bearing, 35 Patella     SURGEON: Kerin Salen    ASSISTANT:   Kerry Hough. [employer]   (Present and scrubbed throughout the case, critical for assistance with exposure, retraction, instrumentation, and closure.)         ANESTHESIA: Spinal, 20cc Exparel, 50cc 0.25% Marcaine  EBL: 300  FLUID REPLACEMENT: 1600 crystalloid  TOURNIQUET TIME: 84min  Drains: None  Tranexamic Acid: 1gm IV, 2gm topical  COMPLICATIONS:  None         INDICATIONS FOR PROCEDURE: The patient has  RIGHT KNEE OSTEOARTHRITIS, Val deformities, XR shows bone on bone arthritis, lateral subluxation of tibia. Patient has failed all conservative measures including anti-inflammatory medicines, narcotics, attempts at  exercise and weight loss, cortisone injections and viscosupplementation.  Risks and benefits of surgery have been discussed, questions answered.   DESCRIPTION OF PROCEDURE: The patient identified by armband, received  IV antibiotics, in the holding area at [facility]. Patient taken to the operating room, appropriate anesthetic  monitors were attached, and Spinal anesthesia was  induced. Tourniquet  applied high to the operative thigh. Lateral post and foot positioner  applied to the table, the lower extremity was then prepped and draped  in  usual sterile fashion from the toes to the tourniquet. Time-out procedure was performed. We began the operation, with the knee flexed 120 degrees, by making the anterior midline incision starting at handbreadth above the patella going over the patella 1 cm medial to and 4 cm distal to the tibial tubercle. Small bleeders in the skin and the  subcutaneous tissue identified and cauterized. Transverse retinaculum was incised and reflected medially and a medial parapatellar arthrotomy was accomplished. the patella was everted and theprepatellar fat pad resected. The superficial medial collateral  ligament was then elevated from anterior to posterior along the proximal  flare of the tibia and anterior half of the menisci resected. The knee was hyperflexed exposing bone on bone arthritis. Peripheral and notch osteophytes as well as the cruciate ligaments were then resected. We continued to  work our way around posteriorly along the proximal  tibia, and externally  rotated the tibia subluxing it out from underneath the femur. A McHale  retractor was placed through the notch and a lateral Hohmann retractor  placed, and we then drilled through the proximal tibia in line with the  axis of the tibia followed by an intramedullary guide rod and 2-degree  posterior slope cutting guide. The tibial cutting guide, 3 degree posterior sloped, was pinned into place allowing resection of 7 mm of bone medially and 1 mm of bone laterally. Satisfied with the tibial resection, we then  entered the distal femur 2 mm anterior to the PCL origin with the  intramedullary guide rod and applied the distal femoral cutting guide  set at 9 mm, with 5 degrees of valgus. This was pinned along the  epicondylar axis. At this point, the distal femoral cut was accomplished without difficulty. We then sized for a #6R femoral component and pinned the guide in 0 degrees of external rotation. The chamfer cutting guide was pinned into place. The  anterior, posterior, and chamfer cuts were accomplished without difficulty followed by  the Attune RP box cutting guide and the box cut. We also removed posterior osteophytes from the posterior femoral condyles. At this  time, the knee was brought into full extension. We checked our  extension and flexion gaps and found them symmetric for a 5 mm bearing. Distracting in extension with a lamina spreader, the posterior horns of the menisci were removed, and Exparel, diluted to 60 cc, with 20cc NS, and 20cc 0.5% Marcaine,was injected into the capsule and synovium of the knee. The posterior patella cut was accomplished with the 9.5 mm Attune cutting guide, sized for a 89mm dome, and the fixation pegs drilled.The knee  was then once again hyperflexed exposing the proximal tibia. We sized for a # 6 tibial base plate, applied the smokestack and the conical reamer followed by the the Delta fin keel punch. We then hammered into place the Attune RP trial femoral component, drilled the lugs, inserted a  5 mm trial bearing, trial patellar button, and took the knee through range of motion from 0-130 degrees. No thumb pressure was required for patellar Tracking. At this point, the limb was wrapped with an Esmarch bandage and the tourniquet inflated to 350 mmHg. All trial components were removed, mating surfaces irrigated with pulse lavage, and dried with suction and sponges. 10 cc of the Exparel solution was applied to the cancellus bone of the patella distal femur and proximal tibia.  After waiting 1 minute, the bony surfaces were again, dried with sponges. A double batch of DePuy HV cement with 1500 mg of Zinacef was mixed and applied to all bony metallic mating surfaces except for the posterior condyles of the femur itself. In order, we hammered into place the tibial tray and removed excess cement, the femoral component and removed excess cement. The final Attune RP bearing  was inserted, and the knee brought to full  extension with compression.  The patellar button was clamped into place, and excess cement  removed. While the cement cured the wound was irrigated out with normal saline solution pulse lavage. Ligament stability and patellar tracking were checked and found to be excellent. The parapatellar arthrotomy was closed with  running #1 Vicryl suture. The subcutaneous tissue with 0 and 2-0 undyed  Vicryl suture, and the skin with running 3-0 SQ vicryl. A dressing of Xeroform,  4 x 4, dressing sponges, Webril, and Ace wrap applied. The patient  awakened, and  taken to recovery room without difficulty.   Kerin Salen 02/22/2018, 11:16 AM

## 2018-02-22 NOTE — Anesthesia Preprocedure Evaluation (Signed)
Anesthesia Evaluation  Patient identified by MRN, date of birth, ID band Patient awake    Reviewed: Allergy & Precautions, NPO status , Patient's Chart, lab work & pertinent test results  Airway Mallampati: I  TM Distance: >3 FB Neck ROM: Full    Dental   Pulmonary    Pulmonary exam normal        Cardiovascular hypertension, Pt. on medications Normal cardiovascular exam     Neuro/Psych    GI/Hepatic GERD  Controlled,  Endo/Other  diabetes, Type 2, Oral Hypoglycemic Agents  Renal/GU      Musculoskeletal   Abdominal   Peds  Hematology   Anesthesia Other Findings   Reproductive/Obstetrics                             Anesthesia Physical Anesthesia Plan  ASA: II  Anesthesia Plan: Spinal   Post-op Pain Management:  Regional for Post-op pain   Induction: Intravenous  PONV Risk Score and Plan: 2 and Ondansetron and Treatment may vary due to age or medical condition  Airway Management Planned: Simple Face Mask  Additional Equipment:   Intra-op Plan:   Post-operative Plan:   Informed Consent: I have reviewed the patients History and Physical, chart, labs and discussed the procedure including the risks, benefits and alternatives for the proposed anesthesia with the patient or authorized representative who has indicated his/her understanding and acceptance.     Plan Discussed with: CRNA and Surgeon  Anesthesia Plan Comments:         Anesthesia Quick Evaluation

## 2018-02-22 NOTE — Transfer of Care (Signed)
Immediate Anesthesia Transfer of Care Note  Patient: Marie [name]  Procedure(s) Performed: TOTAL KNEE ARTHROPLASTY (Right )  Patient Location: PACU  Anesthesia Type:Spinal and GA combined with regional for post-op pain  Level of Consciousness: awake, alert , oriented and patient cooperative  Airway & Oxygen Therapy: Patient Spontanous Breathing and Patient connected to nasal cannula oxygen  Post-op Assessment: Report given to RN and Post -op Vital signs reviewed and stable  Post vital signs: Reviewed and stable  Last Vitals:  Vitals:   02/22/18 0900 02/22/18 0905  BP: (!) 158/54 (!) 145/54  Pulse: 81 73  Resp: (!) 9 12  Temp:    SpO2: 99% 100%    Last Pain:  Vitals:   02/22/18 0807  TempSrc: Oral      Patients Stated Pain Goal: 2 (11/94/17 4081)  Complications: No apparent anesthesia complications

## 2018-02-22 NOTE — Interval H&P Note (Signed)
History and Physical Interval Note:  02/22/2018 9:00 AM  Marie [name]  has presented today for surgery, with the diagnosis of RIGHT KNEE OSTEOARTHRITIS  The various methods of treatment have been discussed with the patient and family. After consideration of risks, benefits and other options for treatment, the patient has consented to  Procedure(s): TOTAL KNEE ARTHROPLASTY (Right) as a surgical intervention .  The patient's history has been reviewed, patient examined, no change in status, stable for surgery.  I have reviewed the patient's chart and labs.  Questions were answered to the patient's satisfaction.     Kerin Salen

## 2018-02-22 NOTE — Anesthesia Postprocedure Evaluation (Signed)
Anesthesia Post Note  Patient: Synethia C Burnley  Procedure(s) Performed: TOTAL KNEE ARTHROPLASTY (Right )     Patient location during evaluation: PACU Anesthesia Type: Spinal Level of consciousness: oriented and awake and alert Pain management: pain level controlled Vital Signs Assessment: post-procedure vital signs reviewed and stable Respiratory status: spontaneous breathing, respiratory function stable and patient connected to nasal cannula oxygen Cardiovascular status: blood pressure returned to baseline and stable Postop Assessment: no headache, no backache and no apparent nausea or vomiting Anesthetic complications: no    Last Vitals:  Vitals:   02/22/18 1220 02/22/18 1253  BP:  (!) 128/58  Pulse:  69  Resp:    Temp: (!) 36.1 C (!) 36.4 C  SpO2:  99%    Last Pain:  Vitals:   02/22/18 1253  TempSrc: Oral                 Kelan Pritt DAVID

## 2018-02-22 NOTE — Anesthesia Procedure Notes (Signed)
Spinal  Start time: 02/22/2018 9:53 AM End time: 02/22/2018 9:55 AM Staffing Anesthesiologist: Lillia Abed, MD Performed: anesthesiologist  Preanesthetic Checklist Completed: patient identified, surgical consent, pre-op evaluation, timeout performed, IV checked, risks and benefits discussed and monitors and equipment checked Spinal Block Patient position: sitting Prep: DuraPrep Patient monitoring: heart rate, cardiac monitor, continuous pulse ox and blood pressure Approach: right paramedian Location: L3-4 Injection technique: single-shot Needle Needle type: Pencan  Needle gauge: 24 G Needle length: 9 cm Needle insertion depth: 6 cm

## 2018-02-22 NOTE — Evaluation (Signed)
Physical Therapy Evaluation Patient Details Name: Marie [name] MRN: [id number] DOB: 1941/07/19 Today's Date: 02/22/2018   History of Present Illness  77yo female who has failed conservative management of R knee pain; she received a R TKR on 02/22/18. PMH OA, DM, HTN, hx breast surgery   Clinical Impression   Patient received in bed, sleeping but easily woken and willing to participate with skilled PT evaluation today. R knee AROM in supine 11 degrees extension, 82 degrees flexion.  She requires MinA for functional bed mobility, but is able to perform functional transfers and sidesteps along the edge of the bed with Min guard today, no significant balance deficits noted while using RW. She declines gait in room or hallway today due to "feeling woozy and groggy" from medications and anesthesia post-surgery, and was left in bed with all needs otherwise met this afternoon, spouse present. She will continue to benefit from skilled PT services in the acute setting as well as skilled PT services as per surgeon's recommendation moving forward.     Follow Up Recommendations Follow surgeon's recommendation for DC plan and follow-up therapies    Equipment Recommendations  None recommended by PT(patient appears to have all necessary DME )    Recommendations for Other Services       Precautions / Restrictions Precautions Precautions: Fall Restrictions Weight Bearing Restrictions: Yes RLE Weight Bearing: Weight bearing as tolerated      Mobility  Bed Mobility Overal bed mobility: Needs Assistance Bed Mobility: Supine to Sit;Sit to Supine     Supine to sit: Min assist Sit to supine: Min assist   General bed mobility comments: MinA to manage painful R LE   Transfers Overall transfer level: Needs assistance Equipment used: Rolling walker (2 wheeled) Transfers: Sit to/from Stand Sit to Stand: Min guard         General transfer comment: cues for safety and sequencing    Ambulation/Gait             General Gait Details: declined gait today due to "feeling drowsy and woozy from medicines" but able to take 3-4 side steps up side of bed with RW and min guard   Stairs            Wheelchair Mobility    Modified Rankin (Stroke Patients Only)       Balance Overall balance assessment: No apparent balance deficits (not formally assessed)                                           Pertinent Vitals/Pain Pain Assessment: 0-10 Pain Score: 4  Pain Location: R knee  Pain Descriptors / Indicators: Aching;Sore Pain Intervention(s): Limited activity within patient's tolerance;Monitored during session;Repositioned    Home Living Family/patient expects to be discharged to:: Private residence Living Arrangements: Spouse/significant other Available Help at Discharge: Family Type of Home: House Home Access: Stairs to enter Entrance Stairs-Rails: None Entrance Stairs-Number of Steps: 2 Home Layout: One level Home Equipment: Environmental consultant - 2 wheels;Cane - single point;Bedside commode;Shower seat      Prior Function Level of Independence: Independent with assistive device(s)         Comments: reports she has mostly been using her cane      Hand Dominance        Extremity/Trunk Assessment   Upper Extremity Assessment Upper Extremity Assessment: Overall WFL for tasks assessed    Lower  Extremity Assessment Lower Extremity Assessment: Generalized weakness    Cervical / Trunk Assessment Cervical / Trunk Assessment: Normal  Communication   Communication: No difficulties  Cognition Arousal/Alertness: Awake/alert Behavior During Therapy: WFL for tasks assessed/performed Overall Cognitive Status: Within Functional Limits for tasks assessed                                        General Comments      Exercises Total Joint Exercises Goniometric ROM: R knee AROM 11 degrees extension, 82 degrees flexion in  supine    Assessment/Plan    PT Assessment Patient needs continued PT services  PT Problem List Decreased strength;Decreased mobility;Decreased safety awareness;Decreased range of motion;Decreased knowledge of precautions;Pain       PT Treatment Interventions DME instruction;Therapeutic activities;Gait training;Therapeutic exercise;Patient/family education;Stair training;Balance training;Functional mobility training;Neuromuscular re-education;Manual techniques    PT Goals (Current goals can be found in the Care Plan section)  Acute Rehab PT Goals Patient Stated Goal: to get well, go home  PT Goal Formulation: With patient/family Time For Goal Achievement: 03/08/18 Potential to Achieve Goals: Good    Frequency 7X/week   Barriers to discharge        Co-evaluation               AM-PAC PT "6 Clicks" Daily Activity  Outcome Measure Difficulty turning over in bed (including adjusting bedclothes, sheets and blankets)?: Unable Difficulty moving from lying on back to sitting on the side of the bed? : Unable Difficulty sitting down on and standing up from a chair with arms (e.g., wheelchair, bedside commode, etc,.)?: Unable Help needed moving to and from a bed to chair (including a wheelchair)?: A Little Help needed walking in hospital room?: A Little Help needed climbing 3-5 steps with a railing? : A Little 6 Click Score: 12    End of Session Equipment Utilized During Treatment: Gait belt Activity Tolerance: Patient tolerated treatment well Patient left: in bed;with call bell/phone within reach;with family/visitor present   PT Visit Diagnosis: Muscle weakness (generalized) (M62.81);Difficulty in walking, not elsewhere classified (R26.2);Pain Pain - Right/Left: Right Pain - part of body: Knee    Time: 1430-1453 PT Time Calculation (min) (ACUTE ONLY): 23 min   Charges:   PT Evaluation $PT Eval Low Complexity: 1 Low PT Treatments $Self Care/Home Management: 8-22   PT  G Codes:        Deniece Ree PT, DPT, CBIS  Supplemental Physical Therapist [place]   Pager [phone]

## 2018-02-22 NOTE — Discharge Instructions (Signed)

## 2018-02-22 NOTE — Anesthesia Procedure Notes (Signed)
Anesthesia Regional Block: Adductor canal block   Pre-Anesthetic Checklist: ,, timeout performed, Correct Patient, Correct Site, Correct Laterality, Correct Procedure, Correct Position, site marked, Risks and benefits discussed,  Surgical consent,  Pre-op evaluation,  At surgeon's request and post-op pain management  Laterality: Right  Prep: chloraprep       Needles:  Injection technique: Single-shot  Needle Type: Echogenic Stimulator Needle     Needle Length: 9cm  Needle Gauge: 21     Additional Needles:   Narrative:  Start time: 02/22/2018 9:00 AM End time: 02/22/2018 9:10 AM Injection made incrementally with aspirations every 5 mL.  Performed by: Personally  Anesthesiologist: Lillia Abed, MD  Additional Notes: Monitors applied. Patient sedated. Sterile prep and drape,hand hygiene and sterile gloves were used. Relevant anatomy identified.Needle position confirmed.Local anesthetic injected incrementally after negative aspiration. Local anesthetic spread visualized around nerve(s). Vascular puncture avoided. No complications. Image printed for medical record.The patient tolerated the procedure well.    Lillia Abed MD

## 2018-02-22 NOTE — Progress Notes (Signed)
Orthopedic Tech Progress Note Patient Details:  Marie [name] 1941-08-08 [id number]  Ortho Devices Ortho Device/Splint Location: foot roll Ortho Device/Splint Interventions: Application   Post Interventions Patient Tolerated: Well Instructions Provided: Care of device   [name] 02/22/2018, 12:07 PM

## 2018-02-23 ENCOUNTER — Encounter (HOSPITAL_COMMUNITY): Payer: Self-pay | Admitting: Orthopedic Surgery

## 2018-02-23 LAB — BASIC METABOLIC PANEL
ANION GAP: 10 (ref 5–15)
BUN: 10 mg/dL (ref 6–20)
CALCIUM: 8.8 mg/dL — AB (ref 8.9–10.3)
CO2: 25 mmol/L (ref 22–32)
Chloride: 99 mmol/L — ABNORMAL LOW (ref 101–111)
Creatinine, Ser: 1 mg/dL (ref 0.44–1.00)
GFR calc non Af Amer: 54 mL/min — ABNORMAL LOW (ref >60)
GLUCOSE: 229 mg/dL — AB (ref 65–99)
POTASSIUM: 5.4 mmol/L — AB (ref 3.5–5.1)
Sodium: 134 mmol/L — ABNORMAL LOW (ref 135–145)

## 2018-02-23 LAB — GLUCOSE, CAPILLARY
GLUCOSE-CAPILLARY: 173 mg/dL — AB (ref 65–99)
GLUCOSE-CAPILLARY: 175 mg/dL — AB (ref 65–99)
Glucose-Capillary: 196 mg/dL — ABNORMAL HIGH (ref 65–99)
Glucose-Capillary: 195 mg/dL — ABNORMAL HIGH (ref 65–99)

## 2018-02-23 LAB — CBC
HEMATOCRIT: 34.9 % — AB (ref 36.0–46.0)
HEMOGLOBIN: 11.2 g/dL — AB (ref 12.0–15.0)
MCH: 30.1 pg (ref 26.0–34.0)
MCHC: 32.1 g/dL (ref 30.0–36.0)
MCV: 93.8 fL (ref 78.0–100.0)
Platelets: 271 10*3/uL (ref 150–400)
RBC: 3.72 MIL/uL — AB (ref 3.87–5.11)
RDW: 14.2 % (ref 11.5–15.5)
WBC: 7.8 10*3/uL (ref 4.0–10.5)

## 2018-02-23 NOTE — Progress Notes (Signed)
Physical Therapy Treatment Patient Details Name: Marie [name] MRN: [id number] DOB: May 11, 1941 Today's Date: 02/23/2018    History of Present Illness 77yo female who has failed conservative management of R knee pain; she received a R TKR on 02/22/18. PMH OA, DM, HTN, hx breast surgery     PT Comments    Patient was limited by fatigue and able to tolerate short distance gait of 63ft then 62ft with seated rest break and limited therex. Continue to progress as tolerated.    Follow Up Recommendations  Follow surgeon's recommendation for DC plan and follow-up therapies     Equipment Recommendations  None recommended by PT(patient appears to have all necessary DME )    Recommendations for Other Services       Precautions / Restrictions Precautions Precautions: Fall Precaution Comments: knee precautions reviewed with pt Restrictions Weight Bearing Restrictions: Yes RLE Weight Bearing: Weight bearing as tolerated    Mobility  Bed Mobility Overal bed mobility: Needs Assistance Bed Mobility: Supine to Sit     Supine to sit: Min guard     General bed mobility comments: increased time and effort; min guard for safety  Transfers Overall transfer level: Needs assistance Equipment used: Rolling walker (2 wheeled) Transfers: Sit to/from Stand Sit to Stand: Min guard;Min assist         General transfer comment: cues for safe hand placement  Ambulation/Gait Ambulation/Gait assistance: Min guard Ambulation Distance (Feet): (21ft then 4ft) Assistive device: Rolling walker (2 wheeled) Gait Pattern/deviations: Step-through pattern;Decreased stance time - right;Decreased step length - left;Antalgic;Decreased dorsiflexion - left Gait velocity: decreased   General Gait Details: cues for sequencing and step length symmetry and height; pt limited by fatigue   Stairs            Wheelchair Mobility    Modified Rankin (Stroke Patients Only)       Balance Overall balance  assessment: Needs assistance   Sitting balance-Leahy Scale: Good     Standing balance support: Bilateral upper extremity supported;During functional activity Standing balance-Leahy Scale: Poor                              Cognition Arousal/Alertness: Awake/alert Behavior During Therapy: WFL for tasks assessed/performed Overall Cognitive Status: Within Functional Limits for tasks assessed                                        Exercises Total Joint Exercises Quad Sets: AROM;Right;10 reps Heel Slides: AAROM;Right;10 reps    General Comments        Pertinent Vitals/Pain Pain Assessment: Faces Faces Pain Scale: Hurts little more Pain Location: R knee  Pain Descriptors / Indicators: Sore Pain Intervention(s): Limited activity within patient's tolerance;Monitored during session;Premedicated before session;Repositioned(pt declined ice )    Home Living                      Prior Function            PT Goals (current goals can now be found in the care plan section) Acute Rehab PT Goals Patient Stated Goal: to get well, go home  PT Goal Formulation: With patient/family Time For Goal Achievement: 03/08/18 Potential to Achieve Goals: Good Progress towards PT goals: Progressing toward goals    Frequency    7X/week      PT Plan  Current plan remains appropriate    Co-evaluation              AM-PAC PT "6 Clicks" Daily Activity  Outcome Measure  Difficulty turning over in bed (including adjusting bedclothes, sheets and blankets)?: A Little Difficulty moving from lying on back to sitting on the side of the bed? : Unable Difficulty sitting down on and standing up from a chair with arms (e.g., wheelchair, bedside commode, etc,.)?: Unable Help needed moving to and from a bed to chair (including a wheelchair)?: A Little Help needed walking in hospital room?: A Little Help needed climbing 3-5 steps with a railing? : A Little 6  Click Score: 14    End of Session Equipment Utilized During Treatment: Gait belt Activity Tolerance: Patient limited by fatigue Patient left: with call bell/phone within reach;with family/visitor present;in chair Nurse Communication: Mobility status PT Visit Diagnosis: Muscle weakness (generalized) (M62.81);Difficulty in walking, not elsewhere classified (R26.2);Pain Pain - Right/Left: Right Pain - part of body: Knee     Time: 3007-6226 PT Time Calculation (min) (ACUTE ONLY): 36 min  Charges:  $Gait Training: 8-22 mins $Therapeutic Activity: 8-22 mins                    G Codes:       Earney Navy, PTA Pager: [phone]     Darliss Cheney 02/23/2018, 4:20 PM

## 2018-02-23 NOTE — Plan of Care (Signed)
  Progressing Education: Knowledge of General Education information will improve 02/23/2018 0853 - Progressing by Harlin Heys, RN Health Behavior/Discharge Planning: Ability to manage health-related needs will improve 02/23/2018 0853 - Progressing by Harlin Heys, RN Clinical Measurements: Ability to maintain clinical measurements within normal limits will improve 02/23/2018 0853 - Progressing by Harlin Heys, RN Will remain free from infection 02/23/2018 0853 - Progressing by Harlin Heys, RN Diagnostic test results will improve 02/23/2018 0853 - Progressing by Harlin Heys, RN Respiratory complications will improve 02/23/2018 0853 - Progressing by Harlin Heys, RN Cardiovascular complication will be avoided 02/23/2018 0853 - Progressing by Harlin Heys, RN Activity: Risk for activity intolerance will decrease 02/23/2018 0853 - Progressing by Harlin Heys, RN Nutrition: Adequate nutrition will be maintained 02/23/2018 0853 - Progressing by Harlin Heys, RN Coping: Level of anxiety will decrease 02/23/2018 0853 - Progressing by Harlin Heys, RN Elimination: Will not experience complications related to bowel motility 02/23/2018 0853 - Progressing by Harlin Heys, RN Will not experience complications related to urinary retention 02/23/2018 0853 - Progressing by Harlin Heys, RN Pain Managment: General experience of comfort will improve 02/23/2018 0853 - Progressing by Harlin Heys, RN Safety: Ability to remain free from injury will improve 02/23/2018 0853 - Progressing by Harlin Heys, RN Skin Integrity: Risk for impaired skin integrity will decrease 02/23/2018 0853 - Progressing by Harlin Heys, RN Education: Knowledge of the prescribed therapeutic regimen will improve 02/23/2018 0853 - Progressing by Harlin Heys, RN Activity: Ability to avoid complications of mobility impairment will improve 02/23/2018 0853 - Progressing by Harlin Heys, RN Range of joint motion will improve 02/23/2018 0853 - Progressing by Harlin Heys, RN Clinical Measurements: Postoperative complications will be avoided or minimized 02/23/2018 0853 - Progressing by Harlin Heys, RN Pain Management: Pain level will decrease with appropriate interventions 02/23/2018 0853 - Progressing by Harlin Heys, RN Skin Integrity: Signs of wound healing will improve 02/23/2018 0853 - Progressing by Harlin Heys, RN

## 2018-02-23 NOTE — Progress Notes (Signed)
PATIENT ID: Marie [name]  MRN: [id number]  DOB/AGE:  75/18/1942 / 77 y.o.  1 Day Post-Op Procedure(s) (LRB): TOTAL KNEE ARTHROPLASTY (Right)    PROGRESS NOTE Subjective: Patient is alert, oriented, No Nausea, No Vomiting, yes passing gas. Taking PO Well. Denies SOB, Chest or Calf Pain. Using Incentive Spirometer, PAS in place. Ambulate To bathroom and back easily, Patient reports pain as 2/10 .    Objective: Vital signs in last 24 hours: Vitals:   02/22/18 1220 02/22/18 1253 02/22/18 2126 02/23/18 0445  BP:  (!) 128/58 (!) 116/51 (!) 93/45  Pulse:  69 73 67  Resp:   16 17  Temp: (!) 97 F (36.1 C) (!) 97.5 F (36.4 C) 98.7 F (37.1 C) 98.7 F (37.1 C)  TempSrc:  Oral Oral Oral  SpO2:  99% 93% 92%  Weight:      Height:          Intake/Output from previous day: I/O last 3 completed shifts: In: 1280 [P.O.:480; I.V.:700; IV Piggyback:100] Out: 500 [Urine:400; Blood:100]   Intake/Output this shift: No intake/output data recorded.   LABORATORY DATA: Recent Labs    02/22/18 1136 02/22/18 1918 02/23/18 0654  GLUCAP 144* 217* 196*    Examination: Neurologically intact ABD soft Neurovascular intact Sensation intact distally Intact pulses distally Dorsiflexion/Plantar flexion intact Incision: scant drainage No cellulitis present Compartment soft}  Assessment:   1 Day Post-Op Procedure(s) (LRB): TOTAL KNEE ARTHROPLASTY (Right) ADDITIONAL DIAGNOSIS: Expected Acute Blood Loss Anemia, Diabetes and Hypertension  Plan: PT/OT WBAT, AROM and PROM  DVT Prophylaxis:  SCDx72hrs, ASA 325 mg BID x 2 weeks DISCHARGE PLAN: Home,When she passes physical therapy DISCHARGE NEEDS: HHPT, Walker and 3-in-1 comode seat     Kerin Salen 02/23/2018, 7:33 AM

## 2018-02-23 NOTE — Care Management Note (Signed)
Case Management Note  Patient Details  Name: Marie [name] MRN: [id number] Date of Birth: Apr 25, 1941  Subjective/Objective:  77 yr old female s/p right total knee arthroplasty.                  Action/Plan: Choice was offered to patient for [place], she has requested [facility]. Case manager called referral to [facility] at [phone], Faxed demographic, orders, H&P, OP note to her at [phone]. Patient will have family support at discharge.   Expected Discharge Date:    02/23/18              Expected Discharge Plan:  [place]  In-House Referral:  NA  Discharge planning Services  CM Consult  Post Acute Care Choice:  Durable Medical Equipment, Home Health Choice offered to:  Patient  DME Arranged:  3-N-1, Walker platform, CPM DME Agency:  [employer]  HH Arranged:  PT [place]:  [facility]  Status of Service:  Completed, signed off  If discussed at [place] of Stay Meetings, dates discussed:    Additional Comments:  Ninfa Meeker, RN 02/23/2018, 12:59 PM

## 2018-02-23 NOTE — Progress Notes (Signed)
Physical Therapy Treatment Patient Details Name: Marie [name] MRN: [id number] DOB: 09-10-1941 Today's Date: 02/23/2018    History of Present Illness 77yo female who has failed conservative management of R knee pain; she received a R TKR on 02/22/18. PMH OA, DM, HTN, hx breast surgery     PT Comments    Patient was eager to participate in therapy however limited by lightheadedness and nausea. BP 83/35 (46) in sitting and SpO2 and pulse WNL. Pt tolerated short distance gait this session. Continue to progress as tolerated.    Follow Up Recommendations  Follow surgeon's recommendation for DC plan and follow-up therapies     Equipment Recommendations  None recommended by PT(patient appears to have all necessary DME )    Recommendations for Other Services       Precautions / Restrictions Precautions Precautions: Fall Precaution Comments: knee precautions reviewed with pt Restrictions Weight Bearing Restrictions: Yes RLE Weight Bearing: Weight bearing as tolerated    Mobility  Bed Mobility Overal bed mobility: Needs Assistance Bed Mobility: Supine to Sit     Supine to sit: Min assist     General bed mobility comments: assist to bring R LE to EOB  Transfers Overall transfer level: Needs assistance Equipment used: Rolling walker (2 wheeled) Transfers: Sit to/from Stand Sit to Stand: Min guard         General transfer comment: cues for safe hand placement  Ambulation/Gait Ambulation/Gait assistance: Min guard Ambulation Distance (Feet): 30 Feet Assistive device: Rolling walker (2 wheeled) Gait Pattern/deviations: Step-through pattern;Decreased stance time - right;Decreased step length - left;Antalgic Gait velocity: decreased   General Gait Details: cues for sequencing and R heel strike   Stairs            Wheelchair Mobility    Modified Rankin (Stroke Patients Only)       Balance Overall balance assessment: Needs assistance   Sitting balance-Leahy  Scale: Good     Standing balance support: Bilateral upper extremity supported;During functional activity Standing balance-Leahy Scale: Poor                              Cognition Arousal/Alertness: Awake/alert Behavior During Therapy: WFL for tasks assessed/performed Overall Cognitive Status: Within Functional Limits for tasks assessed                                        Exercises      General Comments General comments (skin integrity, edema, etc.): pt c/o lightheadedness and nausea with mobility; BP 83/35 (46) in sitting, SpO2 96% on RA, and pulse 67bpm; husband present       Pertinent Vitals/Pain Pain Assessment: Faces Faces Pain Scale: Hurts little more Pain Location: R knee  Pain Descriptors / Indicators: Sore Pain Intervention(s): Limited activity within patient's tolerance;Monitored during session;Premedicated before session;Repositioned    Home Living                      Prior Function            PT Goals (current goals can now be found in the care plan section) Acute Rehab PT Goals Patient Stated Goal: to get well, go home  PT Goal Formulation: With patient/family Time For Goal Achievement: 03/08/18 Potential to Achieve Goals: Good Progress towards PT goals: Progressing toward goals    Frequency  7X/week      PT Plan Current plan remains appropriate    Co-evaluation              AM-PAC PT "6 Clicks" Daily Activity  Outcome Measure  Difficulty turning over in bed (including adjusting bedclothes, sheets and blankets)?: A Little Difficulty moving from lying on back to sitting on the side of the bed? : Unable Difficulty sitting down on and standing up from a chair with arms (e.g., wheelchair, bedside commode, etc,.)?: Unable Help needed moving to and from a bed to chair (including a wheelchair)?: A Little Help needed walking in hospital room?: A Little Help needed climbing 3-5 steps with a railing? : A  Little 6 Click Score: 14    End of Session Equipment Utilized During Treatment: Gait belt Activity Tolerance: Treatment limited secondary to medical complications (Comment) Patient left: with call bell/phone within reach;with family/visitor present;in chair Nurse Communication: Mobility status;Other (comment)(soft BP) PT Visit Diagnosis: Muscle weakness (generalized) (M62.81);Difficulty in walking, not elsewhere classified (R26.2);Pain Pain - Right/Left: Right Pain - part of body: Knee     Time: 0938-1829 PT Time Calculation (min) (ACUTE ONLY): 29 min  Charges:  $Gait Training: 8-22 mins $Therapeutic Activity: 8-22 mins                    G Codes:       Earney Navy, PTA Pager: [phone]     Darliss Cheney 02/23/2018, 11:48 AM

## 2018-02-24 LAB — CBC
HCT: 32.9 % — ABNORMAL LOW (ref 36.0–46.0)
HEMOGLOBIN: 10.2 g/dL — AB (ref 12.0–15.0)
MCH: 29.1 pg (ref 26.0–34.0)
MCHC: 31 g/dL (ref 30.0–36.0)
MCV: 93.7 fL (ref 78.0–100.0)
Platelets: 245 10*3/uL (ref 150–400)
RBC: 3.51 MIL/uL — AB (ref 3.87–5.11)
RDW: 14.5 % (ref 11.5–15.5)
WBC: 8.2 10*3/uL (ref 4.0–10.5)

## 2018-02-24 LAB — GLUCOSE, CAPILLARY: Glucose-Capillary: 169 mg/dL — ABNORMAL HIGH (ref 65–99)

## 2018-02-24 NOTE — Progress Notes (Signed)
Physical Therapy Treatment Patient Details Name: Marie [name] MRN: [id number] DOB: 1941-04-30 Today's Date: 02/24/2018    History of Present Illness 77yo female who has failed conservative management of R knee pain; she received a R TKR on 02/22/18. PMH OA, DM, HTN, hx breast surgery     PT Comments    Patient is making good progress with PT and tolerated gait and stair training well this am. Pt was able to ambulate 144ft with one seated rest break and required min guard/min A for all mobility. Pt with no c/o increased pain or nausea/dizziness with mobility. Pt is on track for d/c home with supervision/assist from husband.    Follow Up Recommendations  Follow surgeon's recommendation for DC plan and follow-up therapies     Equipment Recommendations  None recommended by PT(patient appears to have all necessary DME )    Recommendations for Other Services       Precautions / Restrictions Precautions Precautions: Fall Precaution Comments: knee precautions reviewed with pt Restrictions Weight Bearing Restrictions: Yes RLE Weight Bearing: Weight bearing as tolerated    Mobility  Bed Mobility               General bed mobility comments: pt OOB in chair upon arrival  Transfers Overall transfer level: Needs assistance Equipment used: Rolling walker (2 wheeled) Transfers: Sit to/from Stand Sit to Stand: Min guard         General transfer comment: min guard for safety  Ambulation/Gait Ambulation/Gait assistance: Min guard Ambulation Distance (Feet): 160 Feet(one seated rest break) Assistive device: Rolling walker (2 wheeled) Gait Pattern/deviations: Step-through pattern;Decreased stride length;Decreased weight shift to right Gait velocity: decreased   General Gait Details: cues for posture and sequencing; pt with improved weightbearing on R LE and step through pattern   Stairs Stairs: Yes   Stair Management: No rails;Step to pattern;Backwards;With walker Number  of Stairs: 2 General stair comments: cues for sequencing and technique; assist to stabilize RW; pt given stair handout  Wheelchair Mobility    Modified Rankin (Stroke Patients Only)       Balance Overall balance assessment: Needs assistance   Sitting balance-Leahy Scale: Good     Standing balance support: Bilateral upper extremity supported;During functional activity Standing balance-Leahy Scale: Poor                              Cognition Arousal/Alertness: Awake/alert Behavior During Therapy: WFL for tasks assessed/performed Overall Cognitive Status: Within Functional Limits for tasks assessed                                        Exercises      General Comments General comments (skin integrity, edema, etc.): pt given HEP handout and exercises/frequency reviewed as well as ambulation schedule       Pertinent Vitals/Pain Pain Assessment: Faces Faces Pain Scale: Hurts little more Pain Location: R knee  Pain Descriptors / Indicators: Sore Pain Intervention(s): Limited activity within patient's tolerance;Monitored during session;Premedicated before session;Repositioned    Home Living                      Prior Function            PT Goals (current goals can now be found in the care plan section) Acute Rehab PT Goals Patient Stated Goal: to get  well, go home  PT Goal Formulation: With patient/family Time For Goal Achievement: 03/08/18 Potential to Achieve Goals: Good Progress towards PT goals: Progressing toward goals    Frequency    7X/week      PT Plan Current plan remains appropriate    Co-evaluation              AM-PAC PT "6 Clicks" Daily Activity  Outcome Measure  Difficulty turning over in bed (including adjusting bedclothes, sheets and blankets)?: A Little Difficulty moving from lying on back to sitting on the side of the bed? : Unable Difficulty sitting down on and standing up from a chair with  arms (e.g., wheelchair, bedside commode, etc,.)?: Unable Help needed moving to and from a bed to chair (including a wheelchair)?: A Little Help needed walking in hospital room?: A Little Help needed climbing 3-5 steps with a railing? : A Little 6 Click Score: 14    End of Session Equipment Utilized During Treatment: Gait belt Activity Tolerance: Patient tolerated treatment well Patient left: with call bell/phone within reach;with family/visitor present;in chair Nurse Communication: Mobility status PT Visit Diagnosis: Muscle weakness (generalized) (M62.81);Difficulty in walking, not elsewhere classified (R26.2);Pain Pain - Right/Left: Right Pain - part of body: Knee     Time: 0921-1008 PT Time Calculation (min) (ACUTE ONLY): 47 min  Charges:  $Gait Training: 23-37 mins $Therapeutic Activity: 8-22 mins                    G Codes:       Earney Navy, PTA Pager: [phone]     Darliss Cheney 02/24/2018, 10:13 AM

## 2018-02-24 NOTE — Discharge Summary (Signed)
Patient ID: Marie [name] MRN: [id number] DOB/AGE: 1941/08/13 77 y.o.  Admit date: 02/22/2018 Discharge date: 02/24/2018  Admission Diagnoses:  Principal Problem:   Osteoarthritis of right knee Active Problems:   Primary osteoarthritis of right knee   Discharge Diagnoses:  Same  Past Medical History:  Diagnosis Date  . Arthritis    knees, hands, hips  . Diabetes mellitus without complication ([place])   . GERD (gastroesophageal reflux disease)   . Hyperlipidemia   . Hypertension   . Papilloma of breast    left    Surgeries: Procedure(s): TOTAL KNEE ARTHROPLASTY on 02/22/2018   Consultants:   Discharged Condition: Improved  Hospital Course: Marie [name] is an 77 y.o. female who was admitted 02/22/2018 for operative treatment ofOsteoarthritis of right knee. Patient has severe unremitting pain that affects sleep, daily activities, and work/hobbies. After pre-op clearance the patient was taken to the operating room on 02/22/2018 and underwent  Procedure(s): TOTAL KNEE ARTHROPLASTY.    Patient was given perioperative antibiotics:  Anti-infectives (From admission, onward)   Start     Dose/Rate Route Frequency Ordered Stop   02/22/18 0729  ceFAZolin (ANCEF) IVPB 2g/100 mL premix     2 g 200 mL/hr over 30 Minutes Intravenous On call to O.R. 02/22/18 3614 02/22/18 0957       Patient was given sequential compression devices, early ambulation, and chemoprophylaxis to prevent DVT.  Patient benefited maximally from hospital stay and there were no complications.    Recent vital signs:  Patient Vitals for the past 24 hrs:  BP Temp Temp src Pulse Resp SpO2  02/24/18 0356 112/66 98.6 F (37 C) - 65 20 94 %  02/24/18 0030 (!) 112/44 - - - - -  02/24/18 0022 (!) 104/45 98.7 F (37.1 C) Oral 72 18 96 %  02/23/18 1510 (!) 138/53 98.6 F (37 C) Oral 71 - 96 %  02/23/18 1148 (!) 128/48 - - 66 - -  02/23/18 1137 (!) 83/35 - - 67 - 96 %     Recent laboratory studies:  Recent Labs     02/23/18 0642 02/24/18 0559  WBC 7.8 8.2  HGB 11.2* 10.2*  HCT 34.9* 32.9*  PLT 271 245  NA 134*  --   K 5.4*  --   CL 99*  --   CO2 25  --   BUN 10  --   CREATININE 1.00  --   GLUCOSE 229*  --   CALCIUM 8.8*  --      Discharge Medications:   Allergies as of 02/24/2018   No Known Allergies     Medication List    STOP taking these medications   aspirin 81 MG chewable tablet Replaced by:  aspirin EC 325 MG tablet   traMADol 50 MG tablet Commonly known as:  ULTRAM     TAKE these medications   amLODipine 5 MG tablet Commonly known as:  NORVASC Take 10 mg by mouth daily.   aspirin EC 325 MG tablet Take 1 tablet (325 mg total) by mouth 2 (two) times daily. Replaces:  aspirin 81 MG chewable tablet   CALCIUM 600+D3 PO Take 1 tablet by mouth daily.   glipiZIDE 5 MG 24 hr tablet Commonly known as:  GLUCOTROL XL Take 5 mg by mouth every other day. IN THE MORNING.   lisinopril 40 MG tablet Commonly known as:  PRINIVIL,ZESTRIL Take 40 mg by mouth daily.   metoprolol tartrate 50 MG tablet Commonly known as:  LOPRESSOR Take  50 mg by mouth 2 (two) times daily.   omeprazole 40 MG capsule Commonly known as:  PRILOSEC Take 40 mg by mouth daily.   oxyCODONE-acetaminophen 5-325 MG tablet Commonly known as:  PERCOCET/ROXICET Take 1 tablet by mouth every 4 (four) hours as needed for severe pain.   pioglitazone 30 MG tablet Commonly known as:  ACTOS Take 30 mg by mouth daily.   pravastatin 40 MG tablet Commonly known as:  PRAVACHOL Take 40 mg by mouth at bedtime.   tiZANidine 2 MG tablet Commonly known as:  ZANAFLEX Take 1 tablet (2 mg total) by mouth every 6 (six) hours as needed for muscle spasms.            Durable Medical Equipment  (From admission, onward)        Start     Ordered   02/22/18 1224  DME Walker rolling  Once    Question:  Patient needs a walker to treat with the following condition  Answer:  Status post right knee replacement    02/22/18 1223   02/22/18 1224  DME 3 n 1  Once     02/22/18 1223   02/22/18 1224  DME Bedside commode  Once    Question:  Patient needs a bedside commode to treat with the following condition  Answer:  Status post right knee replacement   02/22/18 1223       Discharge Care Instructions  (From admission, onward)        Start     Ordered   02/24/18 0000  Weight bearing as tolerated     02/24/18 1056      Diagnostic Studies: No results found.  Disposition: Discharge disposition: 01-Home or Self Care       Discharge Instructions    Call MD / Call 911   Complete by:  As directed    If you experience chest pain or shortness of breath, CALL 911 and be transported to the hospital emergency room.  If you develope a fever above 101 F, pus (white drainage) or increased drainage or redness at the wound, or calf pain, call your surgeon's office.   Constipation Prevention   Complete by:  As directed    Drink plenty of fluids.  Prune juice may be helpful.  You may use a stool softener, such as Colace (over the counter) 100 mg twice a day.  Use MiraLax (over the counter) for constipation as needed.   Diet - low sodium heart healthy   Complete by:  As directed    Driving restrictions   Complete by:  As directed    No driving for 2 weeks   Increase activity slowly as tolerated   Complete by:  As directed    Patient may shower   Complete by:  As directed    You may shower without a dressing once there is no drainage.  Do not wash over the wound.  If drainage remains, cover wound with plastic wrap and then shower.   Weight bearing as tolerated   Complete by:  As directed       Follow-up Information    Frederik Pear, MD Follow up in 2 week(s).   Specialty:  Orthopedic Surgery Contact information: [place] [address] [place] [facility] up.   Why:  A representative from [facility] will contact you to arrange start  date and time for your therapy. Contact information: [address] [address] [phone]            Signed: Joanell Rising 02/24/2018, 10:56 AM

## 2018-02-24 NOTE — Progress Notes (Signed)
PATIENT ID: Marie [name]  MRN: [id number]  DOB/AGE:  02/05/1941 / 77 y.o.  2 Days Post-Op Procedure(s) (LRB): TOTAL KNEE ARTHROPLASTY (Right)    PROGRESS NOTE Subjective: Patient is alert, oriented, no Nausea, no Vomiting, yes passing gas. Taking PO well. Denies SOB, Chest or Calf Pain. Using Incentive Spirometer, PAS in place. Ambulate WBAT with pt walking 40 ft with therapy, Patient reports pain as 4/10 .    Objective: Vital signs in last 24 hours: Vitals:   02/23/18 1510 02/24/18 0022 02/24/18 0030 02/24/18 0356  BP: (!) 138/53 (!) 104/45 (!) 112/44 112/66  Pulse: 71 72  65  Resp:  18  20  Temp: 98.6 F (37 C) 98.7 F (37.1 C)  98.6 F (37 C)  TempSrc: Oral Oral    SpO2: 96% 96%  94%  Weight:      Height:          Intake/Output from previous day: No intake/output data recorded.   Intake/Output this shift: No intake/output data recorded.   LABORATORY DATA: Recent Labs    02/23/18 0642  02/23/18 1633 02/23/18 2102 02/24/18 0559 02/24/18 0658  WBC 7.8  --   --   --  8.2  --   HGB 11.2*  --   --   --  10.2*  --   HCT 34.9*  --   --   --  32.9*  --   PLT 271  --   --   --  245  --   NA 134*  --   --   --   --   --   K 5.4*  --   --   --   --   --   CL 99*  --   --   --   --   --   CO2 25  --   --   --   --   --   BUN 10  --   --   --   --   --   CREATININE 1.00  --   --   --   --   --   GLUCOSE 229*  --   --   --   --   --   GLUCAP  --    < > 175* 195*  --  169*  CALCIUM 8.8*  --   --   --   --   --    < > = values in this interval not displayed.    Examination: Neurologically intact Neurovascular intact Sensation intact distally Intact pulses distally Dorsiflexion/Plantar flexion intact Incision: dressing C/D/I and no drainage No cellulitis present Compartment soft}  Assessment:   2 Days Post-Op Procedure(s) (LRB): TOTAL KNEE ARTHROPLASTY (Right) ADDITIONAL DIAGNOSIS: Expected Acute Blood Loss Anemia, Diabetes and Hypertension  Plan: PT/OT WBAT,  AROM and PROM  DVT Prophylaxis:  SCDx72hrs, ASA 325 mg BID x 2 weeks DISCHARGE PLAN: Home, when pt passes therapy goals DISCHARGE NEEDS: HHPT, Walker and 3-in-1 comode seat     Joanell Rising 02/24/2018, 7:45 AM

## 2018-02-24 NOTE — Progress Notes (Signed)
Discharge instructions (including medications) discussed with and copy provided to patient/caregiver, along with paper prescriptions.

## 2018-02-25 DIAGNOSIS — Z7984 Long term (current) use of oral hypoglycemic drugs: Secondary | ICD-10-CM | POA: Diagnosis not present

## 2018-02-25 DIAGNOSIS — Z79891 Long term (current) use of opiate analgesic: Secondary | ICD-10-CM | POA: Diagnosis not present

## 2018-02-25 DIAGNOSIS — E119 Type 2 diabetes mellitus without complications: Secondary | ICD-10-CM | POA: Diagnosis not present

## 2018-02-25 DIAGNOSIS — M1991 Primary osteoarthritis, unspecified site: Secondary | ICD-10-CM | POA: Diagnosis not present

## 2018-02-25 DIAGNOSIS — I10 Essential (primary) hypertension: Secondary | ICD-10-CM | POA: Diagnosis not present

## 2018-02-25 DIAGNOSIS — Z7982 Long term (current) use of aspirin: Secondary | ICD-10-CM | POA: Diagnosis not present

## 2018-02-25 DIAGNOSIS — Z471 Aftercare following joint replacement surgery: Secondary | ICD-10-CM | POA: Diagnosis not present

## 2018-02-25 DIAGNOSIS — Z96651 Presence of right artificial knee joint: Secondary | ICD-10-CM | POA: Diagnosis not present

## 2018-02-26 DIAGNOSIS — Z96651 Presence of right artificial knee joint: Secondary | ICD-10-CM | POA: Diagnosis not present

## 2018-02-26 DIAGNOSIS — Z79891 Long term (current) use of opiate analgesic: Secondary | ICD-10-CM | POA: Diagnosis not present

## 2018-02-26 DIAGNOSIS — Z7984 Long term (current) use of oral hypoglycemic drugs: Secondary | ICD-10-CM | POA: Diagnosis not present

## 2018-02-26 DIAGNOSIS — M1991 Primary osteoarthritis, unspecified site: Secondary | ICD-10-CM | POA: Diagnosis not present

## 2018-02-26 DIAGNOSIS — Z471 Aftercare following joint replacement surgery: Secondary | ICD-10-CM | POA: Diagnosis not present

## 2018-02-26 DIAGNOSIS — Z7982 Long term (current) use of aspirin: Secondary | ICD-10-CM | POA: Diagnosis not present

## 2018-02-26 DIAGNOSIS — I10 Essential (primary) hypertension: Secondary | ICD-10-CM | POA: Diagnosis not present

## 2018-02-26 DIAGNOSIS — E119 Type 2 diabetes mellitus without complications: Secondary | ICD-10-CM | POA: Diagnosis not present

## 2018-03-02 DIAGNOSIS — Z7982 Long term (current) use of aspirin: Secondary | ICD-10-CM | POA: Diagnosis not present

## 2018-03-02 DIAGNOSIS — Z471 Aftercare following joint replacement surgery: Secondary | ICD-10-CM | POA: Diagnosis not present

## 2018-03-02 DIAGNOSIS — Z79891 Long term (current) use of opiate analgesic: Secondary | ICD-10-CM | POA: Diagnosis not present

## 2018-03-02 DIAGNOSIS — I10 Essential (primary) hypertension: Secondary | ICD-10-CM | POA: Diagnosis not present

## 2018-03-02 DIAGNOSIS — M1991 Primary osteoarthritis, unspecified site: Secondary | ICD-10-CM | POA: Diagnosis not present

## 2018-03-02 DIAGNOSIS — Z7984 Long term (current) use of oral hypoglycemic drugs: Secondary | ICD-10-CM | POA: Diagnosis not present

## 2018-03-02 DIAGNOSIS — E119 Type 2 diabetes mellitus without complications: Secondary | ICD-10-CM | POA: Diagnosis not present

## 2018-03-02 DIAGNOSIS — Z96651 Presence of right artificial knee joint: Secondary | ICD-10-CM | POA: Diagnosis not present

## 2018-03-05 DIAGNOSIS — M1991 Primary osteoarthritis, unspecified site: Secondary | ICD-10-CM | POA: Diagnosis not present

## 2018-03-05 DIAGNOSIS — Z471 Aftercare following joint replacement surgery: Secondary | ICD-10-CM | POA: Diagnosis not present

## 2018-03-05 DIAGNOSIS — Z79891 Long term (current) use of opiate analgesic: Secondary | ICD-10-CM | POA: Diagnosis not present

## 2018-03-05 DIAGNOSIS — Z96651 Presence of right artificial knee joint: Secondary | ICD-10-CM | POA: Diagnosis not present

## 2018-03-05 DIAGNOSIS — Z7984 Long term (current) use of oral hypoglycemic drugs: Secondary | ICD-10-CM | POA: Diagnosis not present

## 2018-03-05 DIAGNOSIS — I10 Essential (primary) hypertension: Secondary | ICD-10-CM | POA: Diagnosis not present

## 2018-03-05 DIAGNOSIS — Z7982 Long term (current) use of aspirin: Secondary | ICD-10-CM | POA: Diagnosis not present

## 2018-03-05 DIAGNOSIS — E119 Type 2 diabetes mellitus without complications: Secondary | ICD-10-CM | POA: Diagnosis not present

## 2018-03-08 DIAGNOSIS — M1991 Primary osteoarthritis, unspecified site: Secondary | ICD-10-CM | POA: Diagnosis not present

## 2018-03-08 DIAGNOSIS — I10 Essential (primary) hypertension: Secondary | ICD-10-CM | POA: Diagnosis not present

## 2018-03-08 DIAGNOSIS — Z7982 Long term (current) use of aspirin: Secondary | ICD-10-CM | POA: Diagnosis not present

## 2018-03-08 DIAGNOSIS — Z471 Aftercare following joint replacement surgery: Secondary | ICD-10-CM | POA: Diagnosis not present

## 2018-03-08 DIAGNOSIS — Z7984 Long term (current) use of oral hypoglycemic drugs: Secondary | ICD-10-CM | POA: Diagnosis not present

## 2018-03-08 DIAGNOSIS — Z96651 Presence of right artificial knee joint: Secondary | ICD-10-CM | POA: Diagnosis not present

## 2018-03-08 DIAGNOSIS — E119 Type 2 diabetes mellitus without complications: Secondary | ICD-10-CM | POA: Diagnosis not present

## 2018-03-08 DIAGNOSIS — Z79891 Long term (current) use of opiate analgesic: Secondary | ICD-10-CM | POA: Diagnosis not present

## 2018-03-09 DIAGNOSIS — M1711 Unilateral primary osteoarthritis, right knee: Secondary | ICD-10-CM | POA: Diagnosis not present

## 2018-03-10 DIAGNOSIS — Z96651 Presence of right artificial knee joint: Secondary | ICD-10-CM | POA: Diagnosis not present

## 2018-03-10 DIAGNOSIS — E119 Type 2 diabetes mellitus without complications: Secondary | ICD-10-CM | POA: Diagnosis not present

## 2018-03-10 DIAGNOSIS — Z7982 Long term (current) use of aspirin: Secondary | ICD-10-CM | POA: Diagnosis not present

## 2018-03-10 DIAGNOSIS — Z79891 Long term (current) use of opiate analgesic: Secondary | ICD-10-CM | POA: Diagnosis not present

## 2018-03-10 DIAGNOSIS — Z471 Aftercare following joint replacement surgery: Secondary | ICD-10-CM | POA: Diagnosis not present

## 2018-03-10 DIAGNOSIS — I10 Essential (primary) hypertension: Secondary | ICD-10-CM | POA: Diagnosis not present

## 2018-03-10 DIAGNOSIS — Z7984 Long term (current) use of oral hypoglycemic drugs: Secondary | ICD-10-CM | POA: Diagnosis not present

## 2018-03-10 DIAGNOSIS — M1991 Primary osteoarthritis, unspecified site: Secondary | ICD-10-CM | POA: Diagnosis not present

## 2018-03-16 DIAGNOSIS — Z7982 Long term (current) use of aspirin: Secondary | ICD-10-CM | POA: Diagnosis not present

## 2018-03-16 DIAGNOSIS — E119 Type 2 diabetes mellitus without complications: Secondary | ICD-10-CM | POA: Diagnosis not present

## 2018-03-16 DIAGNOSIS — Z471 Aftercare following joint replacement surgery: Secondary | ICD-10-CM | POA: Diagnosis not present

## 2018-03-16 DIAGNOSIS — Z7984 Long term (current) use of oral hypoglycemic drugs: Secondary | ICD-10-CM | POA: Diagnosis not present

## 2018-03-16 DIAGNOSIS — Z79891 Long term (current) use of opiate analgesic: Secondary | ICD-10-CM | POA: Diagnosis not present

## 2018-03-16 DIAGNOSIS — I10 Essential (primary) hypertension: Secondary | ICD-10-CM | POA: Diagnosis not present

## 2018-03-16 DIAGNOSIS — M1991 Primary osteoarthritis, unspecified site: Secondary | ICD-10-CM | POA: Diagnosis not present

## 2018-03-16 DIAGNOSIS — Z96651 Presence of right artificial knee joint: Secondary | ICD-10-CM | POA: Diagnosis not present

## 2018-03-22 DIAGNOSIS — R2689 Other abnormalities of gait and mobility: Secondary | ICD-10-CM | POA: Diagnosis not present

## 2018-03-22 DIAGNOSIS — R2681 Unsteadiness on feet: Secondary | ICD-10-CM | POA: Diagnosis not present

## 2018-03-22 DIAGNOSIS — M6281 Muscle weakness (generalized): Secondary | ICD-10-CM | POA: Diagnosis not present

## 2018-03-22 DIAGNOSIS — Z96651 Presence of right artificial knee joint: Secondary | ICD-10-CM | POA: Diagnosis not present

## 2018-03-22 DIAGNOSIS — M25661 Stiffness of right knee, not elsewhere classified: Secondary | ICD-10-CM | POA: Diagnosis not present

## 2018-03-22 DIAGNOSIS — M25561 Pain in right knee: Secondary | ICD-10-CM | POA: Diagnosis not present

## 2018-03-30 DIAGNOSIS — R2681 Unsteadiness on feet: Secondary | ICD-10-CM | POA: Diagnosis not present

## 2018-03-30 DIAGNOSIS — Z96651 Presence of right artificial knee joint: Secondary | ICD-10-CM | POA: Diagnosis not present

## 2018-03-30 DIAGNOSIS — R2689 Other abnormalities of gait and mobility: Secondary | ICD-10-CM | POA: Diagnosis not present

## 2018-03-30 DIAGNOSIS — M25661 Stiffness of right knee, not elsewhere classified: Secondary | ICD-10-CM | POA: Diagnosis not present

## 2018-03-30 DIAGNOSIS — M25561 Pain in right knee: Secondary | ICD-10-CM | POA: Diagnosis not present

## 2018-03-30 DIAGNOSIS — M6281 Muscle weakness (generalized): Secondary | ICD-10-CM | POA: Diagnosis not present

## 2018-04-01 DIAGNOSIS — M25661 Stiffness of right knee, not elsewhere classified: Secondary | ICD-10-CM | POA: Diagnosis not present

## 2018-04-01 DIAGNOSIS — R2689 Other abnormalities of gait and mobility: Secondary | ICD-10-CM | POA: Diagnosis not present

## 2018-04-01 DIAGNOSIS — Z96651 Presence of right artificial knee joint: Secondary | ICD-10-CM | POA: Diagnosis not present

## 2018-04-01 DIAGNOSIS — M25561 Pain in right knee: Secondary | ICD-10-CM | POA: Diagnosis not present

## 2018-04-01 DIAGNOSIS — R2681 Unsteadiness on feet: Secondary | ICD-10-CM | POA: Diagnosis not present

## 2018-04-01 DIAGNOSIS — M6281 Muscle weakness (generalized): Secondary | ICD-10-CM | POA: Diagnosis not present

## 2018-04-06 DIAGNOSIS — Z471 Aftercare following joint replacement surgery: Secondary | ICD-10-CM | POA: Diagnosis not present

## 2018-04-06 DIAGNOSIS — Z96651 Presence of right artificial knee joint: Secondary | ICD-10-CM | POA: Diagnosis not present

## 2018-04-06 DIAGNOSIS — M1711 Unilateral primary osteoarthritis, right knee: Secondary | ICD-10-CM | POA: Diagnosis not present

## 2018-04-07 DIAGNOSIS — R2681 Unsteadiness on feet: Secondary | ICD-10-CM | POA: Diagnosis not present

## 2018-04-07 DIAGNOSIS — Z96651 Presence of right artificial knee joint: Secondary | ICD-10-CM | POA: Diagnosis not present

## 2018-04-07 DIAGNOSIS — R2689 Other abnormalities of gait and mobility: Secondary | ICD-10-CM | POA: Diagnosis not present

## 2018-04-07 DIAGNOSIS — M25661 Stiffness of right knee, not elsewhere classified: Secondary | ICD-10-CM | POA: Diagnosis not present

## 2018-04-07 DIAGNOSIS — M25561 Pain in right knee: Secondary | ICD-10-CM | POA: Diagnosis not present

## 2018-04-07 DIAGNOSIS — M6281 Muscle weakness (generalized): Secondary | ICD-10-CM | POA: Diagnosis not present

## 2018-04-14 DIAGNOSIS — R2689 Other abnormalities of gait and mobility: Secondary | ICD-10-CM | POA: Diagnosis not present

## 2018-04-14 DIAGNOSIS — M25661 Stiffness of right knee, not elsewhere classified: Secondary | ICD-10-CM | POA: Diagnosis not present

## 2018-04-14 DIAGNOSIS — Z96651 Presence of right artificial knee joint: Secondary | ICD-10-CM | POA: Diagnosis not present

## 2018-04-14 DIAGNOSIS — M6281 Muscle weakness (generalized): Secondary | ICD-10-CM | POA: Diagnosis not present

## 2018-04-14 DIAGNOSIS — M25561 Pain in right knee: Secondary | ICD-10-CM | POA: Diagnosis not present

## 2018-04-14 DIAGNOSIS — R2681 Unsteadiness on feet: Secondary | ICD-10-CM | POA: Diagnosis not present

## 2018-04-16 DIAGNOSIS — R2681 Unsteadiness on feet: Secondary | ICD-10-CM | POA: Diagnosis not present

## 2018-04-16 DIAGNOSIS — M6281 Muscle weakness (generalized): Secondary | ICD-10-CM | POA: Diagnosis not present

## 2018-04-16 DIAGNOSIS — R2689 Other abnormalities of gait and mobility: Secondary | ICD-10-CM | POA: Diagnosis not present

## 2018-04-16 DIAGNOSIS — M25661 Stiffness of right knee, not elsewhere classified: Secondary | ICD-10-CM | POA: Diagnosis not present

## 2018-04-16 DIAGNOSIS — M25561 Pain in right knee: Secondary | ICD-10-CM | POA: Diagnosis not present

## 2018-04-16 DIAGNOSIS — Z96651 Presence of right artificial knee joint: Secondary | ICD-10-CM | POA: Diagnosis not present

## 2018-04-22 DIAGNOSIS — Z96651 Presence of right artificial knee joint: Secondary | ICD-10-CM | POA: Diagnosis not present

## 2018-04-22 DIAGNOSIS — M25661 Stiffness of right knee, not elsewhere classified: Secondary | ICD-10-CM | POA: Diagnosis not present

## 2018-04-22 DIAGNOSIS — R2681 Unsteadiness on feet: Secondary | ICD-10-CM | POA: Diagnosis not present

## 2018-04-22 DIAGNOSIS — M6281 Muscle weakness (generalized): Secondary | ICD-10-CM | POA: Diagnosis not present

## 2018-04-22 DIAGNOSIS — R2689 Other abnormalities of gait and mobility: Secondary | ICD-10-CM | POA: Diagnosis not present

## 2018-04-22 DIAGNOSIS — M25561 Pain in right knee: Secondary | ICD-10-CM | POA: Diagnosis not present

## 2018-04-26 DIAGNOSIS — M25661 Stiffness of right knee, not elsewhere classified: Secondary | ICD-10-CM | POA: Diagnosis not present

## 2018-04-26 DIAGNOSIS — Z96651 Presence of right artificial knee joint: Secondary | ICD-10-CM | POA: Diagnosis not present

## 2018-04-26 DIAGNOSIS — M6281 Muscle weakness (generalized): Secondary | ICD-10-CM | POA: Diagnosis not present

## 2018-04-26 DIAGNOSIS — R2681 Unsteadiness on feet: Secondary | ICD-10-CM | POA: Diagnosis not present

## 2018-04-26 DIAGNOSIS — R2689 Other abnormalities of gait and mobility: Secondary | ICD-10-CM | POA: Diagnosis not present

## 2018-04-26 DIAGNOSIS — M25561 Pain in right knee: Secondary | ICD-10-CM | POA: Diagnosis not present

## 2018-04-29 DIAGNOSIS — Z96651 Presence of right artificial knee joint: Secondary | ICD-10-CM | POA: Diagnosis not present

## 2018-04-29 DIAGNOSIS — M25561 Pain in right knee: Secondary | ICD-10-CM | POA: Diagnosis not present

## 2018-04-29 DIAGNOSIS — R2681 Unsteadiness on feet: Secondary | ICD-10-CM | POA: Diagnosis not present

## 2018-04-29 DIAGNOSIS — M25661 Stiffness of right knee, not elsewhere classified: Secondary | ICD-10-CM | POA: Diagnosis not present

## 2018-04-29 DIAGNOSIS — R2689 Other abnormalities of gait and mobility: Secondary | ICD-10-CM | POA: Diagnosis not present

## 2018-04-29 DIAGNOSIS — M6281 Muscle weakness (generalized): Secondary | ICD-10-CM | POA: Diagnosis not present

## 2018-05-07 DIAGNOSIS — M25561 Pain in right knee: Secondary | ICD-10-CM | POA: Diagnosis not present

## 2018-05-07 DIAGNOSIS — Z96651 Presence of right artificial knee joint: Secondary | ICD-10-CM | POA: Diagnosis not present

## 2018-05-07 DIAGNOSIS — M6281 Muscle weakness (generalized): Secondary | ICD-10-CM | POA: Diagnosis not present

## 2018-05-07 DIAGNOSIS — R2681 Unsteadiness on feet: Secondary | ICD-10-CM | POA: Diagnosis not present

## 2018-05-07 DIAGNOSIS — M25661 Stiffness of right knee, not elsewhere classified: Secondary | ICD-10-CM | POA: Diagnosis not present

## 2018-05-07 DIAGNOSIS — R2689 Other abnormalities of gait and mobility: Secondary | ICD-10-CM | POA: Diagnosis not present

## 2018-05-10 DIAGNOSIS — M25661 Stiffness of right knee, not elsewhere classified: Secondary | ICD-10-CM | POA: Diagnosis not present

## 2018-05-10 DIAGNOSIS — R2681 Unsteadiness on feet: Secondary | ICD-10-CM | POA: Diagnosis not present

## 2018-05-10 DIAGNOSIS — M25561 Pain in right knee: Secondary | ICD-10-CM | POA: Diagnosis not present

## 2018-05-10 DIAGNOSIS — R2689 Other abnormalities of gait and mobility: Secondary | ICD-10-CM | POA: Diagnosis not present

## 2018-05-10 DIAGNOSIS — M6281 Muscle weakness (generalized): Secondary | ICD-10-CM | POA: Diagnosis not present

## 2018-05-10 DIAGNOSIS — Z96651 Presence of right artificial knee joint: Secondary | ICD-10-CM | POA: Diagnosis not present

## 2018-05-18 DIAGNOSIS — M1711 Unilateral primary osteoarthritis, right knee: Secondary | ICD-10-CM | POA: Diagnosis not present

## 2018-05-19 DIAGNOSIS — E119 Type 2 diabetes mellitus without complications: Secondary | ICD-10-CM | POA: Diagnosis not present

## 2018-06-02 DIAGNOSIS — Z1231 Encounter for screening mammogram for malignant neoplasm of breast: Secondary | ICD-10-CM | POA: Diagnosis not present

## 2018-06-02 DIAGNOSIS — N959 Unspecified menopausal and perimenopausal disorder: Secondary | ICD-10-CM | POA: Diagnosis not present

## 2018-06-03 DIAGNOSIS — Z96641 Presence of right artificial hip joint: Secondary | ICD-10-CM | POA: Diagnosis not present

## 2018-06-03 DIAGNOSIS — Z09 Encounter for follow-up examination after completed treatment for conditions other than malignant neoplasm: Secondary | ICD-10-CM | POA: Diagnosis not present

## 2018-06-03 DIAGNOSIS — M25551 Pain in right hip: Secondary | ICD-10-CM | POA: Diagnosis not present

## 2018-06-23 DIAGNOSIS — M810 Age-related osteoporosis without current pathological fracture: Secondary | ICD-10-CM | POA: Diagnosis not present

## 2018-07-25 DIAGNOSIS — S2243XA Multiple fractures of ribs, bilateral, initial encounter for closed fracture: Secondary | ICD-10-CM

## 2018-07-25 DIAGNOSIS — E119 Type 2 diabetes mellitus without complications: Secondary | ICD-10-CM | POA: Diagnosis not present

## 2018-07-25 DIAGNOSIS — I4891 Unspecified atrial fibrillation: Secondary | ICD-10-CM | POA: Diagnosis not present

## 2018-07-25 DIAGNOSIS — I10 Essential (primary) hypertension: Secondary | ICD-10-CM | POA: Diagnosis not present

## 2018-07-25 DIAGNOSIS — D62 Acute posthemorrhagic anemia: Secondary | ICD-10-CM | POA: Diagnosis not present

## 2018-07-25 DIAGNOSIS — S32049A Unspecified fracture of fourth lumbar vertebra, initial encounter for closed fracture: Secondary | ICD-10-CM | POA: Diagnosis not present

## 2018-07-25 DIAGNOSIS — S51812A Laceration without foreign body of left forearm, initial encounter: Secondary | ICD-10-CM | POA: Diagnosis not present

## 2018-07-25 DIAGNOSIS — K219 Gastro-esophageal reflux disease without esophagitis: Secondary | ICD-10-CM | POA: Diagnosis not present

## 2018-07-25 DIAGNOSIS — M6281 Muscle weakness (generalized): Secondary | ICD-10-CM | POA: Diagnosis not present

## 2018-07-25 DIAGNOSIS — R918 Other nonspecific abnormal finding of lung field: Secondary | ICD-10-CM | POA: Diagnosis not present

## 2018-07-25 DIAGNOSIS — I480 Paroxysmal atrial fibrillation: Secondary | ICD-10-CM | POA: Diagnosis not present

## 2018-07-25 DIAGNOSIS — S32048A Other fracture of fourth lumbar vertebra, initial encounter for closed fracture: Secondary | ICD-10-CM | POA: Diagnosis not present

## 2018-07-25 DIAGNOSIS — S329XXA Fracture of unspecified parts of lumbosacral spine and pelvis, initial encounter for closed fracture: Secondary | ICD-10-CM | POA: Diagnosis not present

## 2018-07-25 DIAGNOSIS — S32591A Other specified fracture of right pubis, initial encounter for closed fracture: Secondary | ICD-10-CM | POA: Diagnosis not present

## 2018-07-25 DIAGNOSIS — R0789 Other chest pain: Secondary | ICD-10-CM | POA: Diagnosis not present

## 2018-07-25 DIAGNOSIS — L03211 Cellulitis of face: Secondary | ICD-10-CM | POA: Diagnosis not present

## 2018-07-25 DIAGNOSIS — S32599A Other specified fracture of unspecified pubis, initial encounter for closed fracture: Secondary | ICD-10-CM | POA: Diagnosis not present

## 2018-07-25 DIAGNOSIS — R262 Difficulty in walking, not elsewhere classified: Secondary | ICD-10-CM | POA: Diagnosis not present

## 2018-07-25 DIAGNOSIS — R079 Chest pain, unspecified: Secondary | ICD-10-CM | POA: Diagnosis not present

## 2018-07-25 DIAGNOSIS — K5903 Drug induced constipation: Secondary | ICD-10-CM | POA: Diagnosis not present

## 2018-07-25 DIAGNOSIS — T40605A Adverse effect of unspecified narcotics, initial encounter: Secondary | ICD-10-CM | POA: Diagnosis not present

## 2018-07-25 DIAGNOSIS — M79604 Pain in right leg: Secondary | ICD-10-CM | POA: Diagnosis not present

## 2018-07-25 DIAGNOSIS — S22009A Unspecified fracture of unspecified thoracic vertebra, initial encounter for closed fracture: Secondary | ICD-10-CM | POA: Insufficient documentation

## 2018-07-25 DIAGNOSIS — S3289XA Fracture of other parts of pelvis, initial encounter for closed fracture: Secondary | ICD-10-CM | POA: Diagnosis not present

## 2018-07-25 DIAGNOSIS — S3282XA Multiple fractures of pelvis without disruption of pelvic ring, initial encounter for closed fracture: Secondary | ICD-10-CM | POA: Diagnosis not present

## 2018-07-25 DIAGNOSIS — S22059A Unspecified fracture of T5-T6 vertebra, initial encounter for closed fracture: Secondary | ICD-10-CM | POA: Diagnosis not present

## 2018-07-25 DIAGNOSIS — S3210XA Unspecified fracture of sacrum, initial encounter for closed fracture: Secondary | ICD-10-CM

## 2018-07-25 DIAGNOSIS — G4700 Insomnia, unspecified: Secondary | ICD-10-CM | POA: Diagnosis not present

## 2018-07-25 DIAGNOSIS — Z7984 Long term (current) use of oral hypoglycemic drugs: Secondary | ICD-10-CM | POA: Diagnosis not present

## 2018-07-25 DIAGNOSIS — S32121A Minimally displaced Zone II fracture of sacrum, initial encounter for closed fracture: Secondary | ICD-10-CM | POA: Diagnosis not present

## 2018-07-25 DIAGNOSIS — I4892 Unspecified atrial flutter: Secondary | ICD-10-CM | POA: Diagnosis not present

## 2018-07-25 DIAGNOSIS — E785 Hyperlipidemia, unspecified: Secondary | ICD-10-CM | POA: Diagnosis not present

## 2018-07-25 DIAGNOSIS — Z87828 Personal history of other (healed) physical injury and trauma: Secondary | ICD-10-CM | POA: Diagnosis not present

## 2018-07-25 DIAGNOSIS — R0902 Hypoxemia: Secondary | ICD-10-CM | POA: Diagnosis not present

## 2018-07-25 DIAGNOSIS — L0390 Cellulitis, unspecified: Secondary | ICD-10-CM | POA: Diagnosis not present

## 2018-07-25 DIAGNOSIS — S0083XA Contusion of other part of head, initial encounter: Secondary | ICD-10-CM | POA: Diagnosis not present

## 2018-07-25 DIAGNOSIS — S2243XD Multiple fractures of ribs, bilateral, subsequent encounter for fracture with routine healing: Secondary | ICD-10-CM | POA: Diagnosis not present

## 2018-07-25 DIAGNOSIS — Z041 Encounter for examination and observation following transport accident: Secondary | ICD-10-CM | POA: Diagnosis not present

## 2018-07-25 DIAGNOSIS — E871 Hypo-osmolality and hyponatremia: Secondary | ICD-10-CM | POA: Diagnosis not present

## 2018-07-25 DIAGNOSIS — S2231XA Fracture of one rib, right side, initial encounter for closed fracture: Secondary | ICD-10-CM | POA: Diagnosis not present

## 2018-07-25 DIAGNOSIS — S32810A Multiple fractures of pelvis with stable disruption of pelvic ring, initial encounter for closed fracture: Secondary | ICD-10-CM | POA: Diagnosis not present

## 2018-07-25 DIAGNOSIS — Z741 Need for assistance with personal care: Secondary | ICD-10-CM | POA: Diagnosis not present

## 2018-07-25 DIAGNOSIS — S32059A Unspecified fracture of fifth lumbar vertebra, initial encounter for closed fracture: Secondary | ICD-10-CM | POA: Diagnosis not present

## 2018-07-25 DIAGNOSIS — S32058A Other fracture of fifth lumbar vertebra, initial encounter for closed fracture: Secondary | ICD-10-CM | POA: Diagnosis not present

## 2018-07-25 DIAGNOSIS — R2689 Other abnormalities of gait and mobility: Secondary | ICD-10-CM | POA: Diagnosis not present

## 2018-07-25 DIAGNOSIS — S22058A Other fracture of T5-T6 vertebra, initial encounter for closed fracture: Secondary | ICD-10-CM | POA: Diagnosis not present

## 2018-07-25 HISTORY — DX: Unspecified fracture of sacrum, initial encounter for closed fracture: S32.10XA

## 2018-07-25 HISTORY — DX: Multiple fractures of ribs, bilateral, initial encounter for closed fracture: S22.43XA

## 2018-07-25 HISTORY — DX: Unspecified fracture of unspecified thoracic vertebra, initial encounter for closed fracture: S22.009A

## 2018-08-04 DIAGNOSIS — S32048A Other fracture of fourth lumbar vertebra, initial encounter for closed fracture: Secondary | ICD-10-CM | POA: Diagnosis not present

## 2018-08-04 DIAGNOSIS — Z96651 Presence of right artificial knee joint: Secondary | ICD-10-CM | POA: Diagnosis not present

## 2018-08-04 DIAGNOSIS — S329XXA Fracture of unspecified parts of lumbosacral spine and pelvis, initial encounter for closed fracture: Secondary | ICD-10-CM | POA: Diagnosis not present

## 2018-08-04 DIAGNOSIS — I480 Paroxysmal atrial fibrillation: Secondary | ICD-10-CM | POA: Diagnosis not present

## 2018-08-04 DIAGNOSIS — S2243XD Multiple fractures of ribs, bilateral, subsequent encounter for fracture with routine healing: Secondary | ICD-10-CM | POA: Diagnosis not present

## 2018-08-04 DIAGNOSIS — E785 Hyperlipidemia, unspecified: Secondary | ICD-10-CM | POA: Diagnosis not present

## 2018-08-04 DIAGNOSIS — R262 Difficulty in walking, not elsewhere classified: Secondary | ICD-10-CM | POA: Diagnosis not present

## 2018-08-04 DIAGNOSIS — K219 Gastro-esophageal reflux disease without esophagitis: Secondary | ICD-10-CM | POA: Diagnosis not present

## 2018-08-04 DIAGNOSIS — M25561 Pain in right knee: Secondary | ICD-10-CM | POA: Diagnosis not present

## 2018-08-04 DIAGNOSIS — Z87828 Personal history of other (healed) physical injury and trauma: Secondary | ICD-10-CM | POA: Diagnosis not present

## 2018-08-04 DIAGNOSIS — S22058A Other fracture of T5-T6 vertebra, initial encounter for closed fracture: Secondary | ICD-10-CM | POA: Diagnosis not present

## 2018-08-04 DIAGNOSIS — L0390 Cellulitis, unspecified: Secondary | ICD-10-CM | POA: Diagnosis not present

## 2018-08-04 DIAGNOSIS — S32591A Other specified fracture of right pubis, initial encounter for closed fracture: Secondary | ICD-10-CM | POA: Diagnosis not present

## 2018-08-04 DIAGNOSIS — E119 Type 2 diabetes mellitus without complications: Secondary | ICD-10-CM | POA: Diagnosis not present

## 2018-08-04 DIAGNOSIS — G4700 Insomnia, unspecified: Secondary | ICD-10-CM | POA: Diagnosis not present

## 2018-08-04 DIAGNOSIS — M6281 Muscle weakness (generalized): Secondary | ICD-10-CM | POA: Diagnosis not present

## 2018-08-04 DIAGNOSIS — S2243XA Multiple fractures of ribs, bilateral, initial encounter for closed fracture: Secondary | ICD-10-CM | POA: Diagnosis not present

## 2018-08-04 DIAGNOSIS — Z741 Need for assistance with personal care: Secondary | ICD-10-CM | POA: Diagnosis not present

## 2018-08-04 DIAGNOSIS — I10 Essential (primary) hypertension: Secondary | ICD-10-CM | POA: Diagnosis not present

## 2018-08-04 DIAGNOSIS — S22009A Unspecified fracture of unspecified thoracic vertebra, initial encounter for closed fracture: Secondary | ICD-10-CM | POA: Diagnosis not present

## 2018-08-04 DIAGNOSIS — I4891 Unspecified atrial fibrillation: Secondary | ICD-10-CM | POA: Diagnosis not present

## 2018-08-04 DIAGNOSIS — R2689 Other abnormalities of gait and mobility: Secondary | ICD-10-CM | POA: Diagnosis not present

## 2018-08-04 DIAGNOSIS — M79604 Pain in right leg: Secondary | ICD-10-CM | POA: Diagnosis not present

## 2018-08-05 DIAGNOSIS — E119 Type 2 diabetes mellitus without complications: Secondary | ICD-10-CM | POA: Diagnosis not present

## 2018-08-05 DIAGNOSIS — I10 Essential (primary) hypertension: Secondary | ICD-10-CM | POA: Diagnosis not present

## 2018-08-05 DIAGNOSIS — L0390 Cellulitis, unspecified: Secondary | ICD-10-CM | POA: Diagnosis not present

## 2018-08-05 DIAGNOSIS — I4891 Unspecified atrial fibrillation: Secondary | ICD-10-CM | POA: Diagnosis not present

## 2018-08-17 DIAGNOSIS — I10 Essential (primary) hypertension: Secondary | ICD-10-CM | POA: Diagnosis not present

## 2018-08-17 DIAGNOSIS — I4891 Unspecified atrial fibrillation: Secondary | ICD-10-CM | POA: Diagnosis not present

## 2018-08-17 DIAGNOSIS — L0390 Cellulitis, unspecified: Secondary | ICD-10-CM | POA: Diagnosis not present

## 2018-08-17 DIAGNOSIS — E119 Type 2 diabetes mellitus without complications: Secondary | ICD-10-CM | POA: Diagnosis not present

## 2018-08-19 DIAGNOSIS — Z96651 Presence of right artificial knee joint: Secondary | ICD-10-CM | POA: Diagnosis not present

## 2018-08-19 DIAGNOSIS — M25561 Pain in right knee: Secondary | ICD-10-CM | POA: Diagnosis not present

## 2018-08-22 DIAGNOSIS — Z96651 Presence of right artificial knee joint: Secondary | ICD-10-CM | POA: Diagnosis not present

## 2018-08-22 DIAGNOSIS — I10 Essential (primary) hypertension: Secondary | ICD-10-CM | POA: Diagnosis not present

## 2018-08-22 DIAGNOSIS — M79604 Pain in right leg: Secondary | ICD-10-CM | POA: Diagnosis not present

## 2018-08-22 DIAGNOSIS — I4891 Unspecified atrial fibrillation: Secondary | ICD-10-CM | POA: Diagnosis not present

## 2018-08-22 DIAGNOSIS — R2689 Other abnormalities of gait and mobility: Secondary | ICD-10-CM | POA: Diagnosis not present

## 2018-08-22 DIAGNOSIS — S329XXA Fracture of unspecified parts of lumbosacral spine and pelvis, initial encounter for closed fracture: Secondary | ICD-10-CM | POA: Diagnosis not present

## 2018-08-22 DIAGNOSIS — G4700 Insomnia, unspecified: Secondary | ICD-10-CM | POA: Diagnosis not present

## 2018-08-22 DIAGNOSIS — S2243XA Multiple fractures of ribs, bilateral, initial encounter for closed fracture: Secondary | ICD-10-CM | POA: Diagnosis not present

## 2018-08-22 DIAGNOSIS — R262 Difficulty in walking, not elsewhere classified: Secondary | ICD-10-CM | POA: Diagnosis not present

## 2018-08-22 DIAGNOSIS — Z7984 Long term (current) use of oral hypoglycemic drugs: Secondary | ICD-10-CM | POA: Diagnosis not present

## 2018-08-22 DIAGNOSIS — E785 Hyperlipidemia, unspecified: Secondary | ICD-10-CM | POA: Diagnosis not present

## 2018-08-22 DIAGNOSIS — M6281 Muscle weakness (generalized): Secondary | ICD-10-CM | POA: Diagnosis not present

## 2018-08-22 DIAGNOSIS — Z7982 Long term (current) use of aspirin: Secondary | ICD-10-CM | POA: Diagnosis not present

## 2018-08-22 DIAGNOSIS — M1991 Primary osteoarthritis, unspecified site: Secondary | ICD-10-CM | POA: Diagnosis not present

## 2018-08-22 DIAGNOSIS — S22009A Unspecified fracture of unspecified thoracic vertebra, initial encounter for closed fracture: Secondary | ICD-10-CM | POA: Diagnosis not present

## 2018-08-22 DIAGNOSIS — Z79891 Long term (current) use of opiate analgesic: Secondary | ICD-10-CM | POA: Diagnosis not present

## 2018-08-22 DIAGNOSIS — E119 Type 2 diabetes mellitus without complications: Secondary | ICD-10-CM | POA: Diagnosis not present

## 2018-08-22 DIAGNOSIS — K219 Gastro-esophageal reflux disease without esophagitis: Secondary | ICD-10-CM | POA: Diagnosis not present

## 2018-08-23 DIAGNOSIS — R262 Difficulty in walking, not elsewhere classified: Secondary | ICD-10-CM | POA: Diagnosis not present

## 2018-08-23 DIAGNOSIS — S2239XA Fracture of one rib, unspecified side, initial encounter for closed fracture: Secondary | ICD-10-CM | POA: Diagnosis not present

## 2018-08-24 DIAGNOSIS — S22009A Unspecified fracture of unspecified thoracic vertebra, initial encounter for closed fracture: Secondary | ICD-10-CM | POA: Diagnosis not present

## 2018-08-24 DIAGNOSIS — M1991 Primary osteoarthritis, unspecified site: Secondary | ICD-10-CM | POA: Diagnosis not present

## 2018-08-24 DIAGNOSIS — Z7982 Long term (current) use of aspirin: Secondary | ICD-10-CM | POA: Diagnosis not present

## 2018-08-24 DIAGNOSIS — E785 Hyperlipidemia, unspecified: Secondary | ICD-10-CM | POA: Diagnosis not present

## 2018-08-24 DIAGNOSIS — I4891 Unspecified atrial fibrillation: Secondary | ICD-10-CM | POA: Diagnosis not present

## 2018-08-24 DIAGNOSIS — S329XXA Fracture of unspecified parts of lumbosacral spine and pelvis, initial encounter for closed fracture: Secondary | ICD-10-CM | POA: Diagnosis not present

## 2018-08-24 DIAGNOSIS — R2689 Other abnormalities of gait and mobility: Secondary | ICD-10-CM | POA: Diagnosis not present

## 2018-08-24 DIAGNOSIS — R262 Difficulty in walking, not elsewhere classified: Secondary | ICD-10-CM | POA: Diagnosis not present

## 2018-08-24 DIAGNOSIS — S2243XA Multiple fractures of ribs, bilateral, initial encounter for closed fracture: Secondary | ICD-10-CM | POA: Diagnosis not present

## 2018-08-24 DIAGNOSIS — I10 Essential (primary) hypertension: Secondary | ICD-10-CM | POA: Diagnosis not present

## 2018-08-24 DIAGNOSIS — M6281 Muscle weakness (generalized): Secondary | ICD-10-CM | POA: Diagnosis not present

## 2018-08-24 DIAGNOSIS — M79604 Pain in right leg: Secondary | ICD-10-CM | POA: Diagnosis not present

## 2018-08-24 DIAGNOSIS — Z7984 Long term (current) use of oral hypoglycemic drugs: Secondary | ICD-10-CM | POA: Diagnosis not present

## 2018-08-24 DIAGNOSIS — Z96651 Presence of right artificial knee joint: Secondary | ICD-10-CM | POA: Diagnosis not present

## 2018-08-24 DIAGNOSIS — E119 Type 2 diabetes mellitus without complications: Secondary | ICD-10-CM | POA: Diagnosis not present

## 2018-08-24 DIAGNOSIS — Z79891 Long term (current) use of opiate analgesic: Secondary | ICD-10-CM | POA: Diagnosis not present

## 2018-08-24 DIAGNOSIS — K219 Gastro-esophageal reflux disease without esophagitis: Secondary | ICD-10-CM | POA: Diagnosis not present

## 2018-08-24 DIAGNOSIS — G4700 Insomnia, unspecified: Secondary | ICD-10-CM | POA: Diagnosis not present

## 2018-08-31 DIAGNOSIS — K219 Gastro-esophageal reflux disease without esophagitis: Secondary | ICD-10-CM | POA: Diagnosis not present

## 2018-08-31 DIAGNOSIS — Z7982 Long term (current) use of aspirin: Secondary | ICD-10-CM | POA: Diagnosis not present

## 2018-08-31 DIAGNOSIS — G4700 Insomnia, unspecified: Secondary | ICD-10-CM | POA: Diagnosis not present

## 2018-08-31 DIAGNOSIS — M79604 Pain in right leg: Secondary | ICD-10-CM | POA: Diagnosis not present

## 2018-08-31 DIAGNOSIS — M1991 Primary osteoarthritis, unspecified site: Secondary | ICD-10-CM | POA: Diagnosis not present

## 2018-08-31 DIAGNOSIS — E785 Hyperlipidemia, unspecified: Secondary | ICD-10-CM | POA: Diagnosis not present

## 2018-08-31 DIAGNOSIS — Z96651 Presence of right artificial knee joint: Secondary | ICD-10-CM | POA: Diagnosis not present

## 2018-08-31 DIAGNOSIS — S2243XA Multiple fractures of ribs, bilateral, initial encounter for closed fracture: Secondary | ICD-10-CM | POA: Diagnosis not present

## 2018-08-31 DIAGNOSIS — R262 Difficulty in walking, not elsewhere classified: Secondary | ICD-10-CM | POA: Diagnosis not present

## 2018-08-31 DIAGNOSIS — M6281 Muscle weakness (generalized): Secondary | ICD-10-CM | POA: Diagnosis not present

## 2018-08-31 DIAGNOSIS — I10 Essential (primary) hypertension: Secondary | ICD-10-CM | POA: Diagnosis not present

## 2018-08-31 DIAGNOSIS — R2689 Other abnormalities of gait and mobility: Secondary | ICD-10-CM | POA: Diagnosis not present

## 2018-08-31 DIAGNOSIS — Z79891 Long term (current) use of opiate analgesic: Secondary | ICD-10-CM | POA: Diagnosis not present

## 2018-08-31 DIAGNOSIS — S329XXA Fracture of unspecified parts of lumbosacral spine and pelvis, initial encounter for closed fracture: Secondary | ICD-10-CM | POA: Diagnosis not present

## 2018-08-31 DIAGNOSIS — E119 Type 2 diabetes mellitus without complications: Secondary | ICD-10-CM | POA: Diagnosis not present

## 2018-08-31 DIAGNOSIS — I4891 Unspecified atrial fibrillation: Secondary | ICD-10-CM | POA: Diagnosis not present

## 2018-08-31 DIAGNOSIS — S22009A Unspecified fracture of unspecified thoracic vertebra, initial encounter for closed fracture: Secondary | ICD-10-CM | POA: Diagnosis not present

## 2018-08-31 DIAGNOSIS — Z7984 Long term (current) use of oral hypoglycemic drugs: Secondary | ICD-10-CM | POA: Diagnosis not present

## 2018-09-29 DIAGNOSIS — L0390 Cellulitis, unspecified: Secondary | ICD-10-CM | POA: Diagnosis not present

## 2018-09-29 DIAGNOSIS — Z23 Encounter for immunization: Secondary | ICD-10-CM | POA: Diagnosis not present

## 2018-11-09 DIAGNOSIS — M7061 Trochanteric bursitis, right hip: Secondary | ICD-10-CM | POA: Diagnosis not present

## 2018-11-24 DIAGNOSIS — I10 Essential (primary) hypertension: Secondary | ICD-10-CM | POA: Diagnosis not present

## 2018-11-24 DIAGNOSIS — E119 Type 2 diabetes mellitus without complications: Secondary | ICD-10-CM | POA: Diagnosis not present

## 2018-11-24 DIAGNOSIS — G4700 Insomnia, unspecified: Secondary | ICD-10-CM | POA: Diagnosis not present

## 2018-12-09 DIAGNOSIS — K649 Unspecified hemorrhoids: Secondary | ICD-10-CM | POA: Diagnosis not present

## 2018-12-15 ENCOUNTER — Other Ambulatory Visit: Payer: Self-pay | Admitting: Orthopedic Surgery

## 2018-12-27 NOTE — Patient Instructions (Addendum)
Marie [name]  12/27/2018   Your procedure is scheduled on: 01-03-19    Report to [facility] [address]  Entrance    Report to Admitting at 1:00 PM    Call this number if you have problems the morning of surgery [phone]    Remember: Do not eat food or drink liquids :After Midnight. You may have a Clear Liquid Diet from Midnight until 9:00 AM. After 9:00 AM, nothing until after surgery.      CLEAR LIQUID DIET   Foods Allowed                                                                     Foods Excluded  Coffee and tea, regular and decaf                             liquids that you cannot  Plain Jell-O in any flavor                                             see through such as: Fruit ices (not with fruit pulp)                                     milk, soups, orange juice  Iced Popsicles                                    All solid food Carbonated beverages, regular and diet                                    Cranberry, grape and apple juices Sports drinks like Gatorade Lightly seasoned clear broth or consume(fat free) Sugar, honey syrup  Sample Menu Breakfast                                Lunch                                     Supper Cranberry juice                    Beef broth                            Chicken broth Jell-O                                     Grape juice  Apple juice Coffee or tea                        Jell-O                                      Popsicle                                                Coffee or tea                        Coffee or tea  _____________________________________________________________________     Take these medicines the morning of surgery with A SIP OF WATER: Amlodipine (Norvasc), Metoprolol (Lopressor) and Omeprazole (Prilosec)    BRUSH YOUR TEETH MORNING OF SURGERY AND RINSE YOUR MOUTH OUT, NO CHEWING GUM CANDY OR MINTS.    DO NOT TAKE ANY DIABETIC MEDICATIONS DAY  OF YOUR SURGERY                               You may not have any metal on your body including hair pins and              piercings  Do not wear jewelry, make-up, lotions, powders or perfumes, deodorant             Do not wear nail polish.  Do not shave  48 hours prior to surgery.                 Do not bring valuables to the hospital. [place].  Contacts, dentures or bridgework may not be worn into surgery.  Leave suitcase in the car. After surgery it may be brought to your room.    Patients discharged the day of surgery will not be allowed to drive home. IF YOU ARE HAVING SURGERY AND GOING HOME THE SAME DAY, YOU MUST HAVE AN ADULT TO DRIVE YOU HOME AND BE WITH YOU FOR 24 HOURS. YOU MAY GO HOME BY TAXI OR UBER OR ORTHERWISE, BUT AN ADULT MUST ACCOMPANY YOU HOME AND STAY WITH YOU FOR 24 HOURS.    Special Instructions: N/A              Please read over the following fact sheets you were given: _____________________________________________________________________          How to Manage Your Diabetes Before and After Surgery  Why is it important to control my blood sugar before and after surgery? . Improving blood sugar levels before and after surgery helps healing and can limit problems. . A way of improving blood sugar control is eating a healthy diet by: o  Eating less sugar and carbohydrates o  Increasing activity/exercise o  Talking with your doctor about reaching your blood sugar goals . High blood sugars (greater than 180 mg/dL) can raise your risk of infections and slow your recovery, so you will need to focus on controlling your diabetes during the weeks before surgery. . Make sure that the doctor who takes care of your diabetes knows about your planned surgery including the  date and location.  How do I manage my blood sugar before surgery? . Check your blood sugar at least 4 times a day, starting 2 days before surgery, to make  sure that the level is not too high or low. o Check your blood sugar the morning of your surgery when you wake up and every 2 hours until you get to the Short Stay unit. . If your blood sugar is less than 70 mg/dL, you will need to treat for low blood sugar: o Do not take insulin. o Treat a low blood sugar (less than 70 mg/dL) with  cup of clear juice (cranberry or apple), 4 glucose tablets, OR glucose gel. o Recheck blood sugar in 15 minutes after treatment (to make sure it is greater than 70 mg/dL). If your blood sugar is not greater than 70 mg/dL on recheck, call [phone] for further instructions. . Report your blood sugar to the short stay nurse when you get to Short Stay.  . If you are admitted to the hospital after surgery: o Your blood sugar will be checked by the staff and you will probably be given insulin after surgery (instead of oral diabetes medicines) to make sure you have good blood sugar levels. o The goal for blood sugar control after surgery is 80-180 mg/dL.   WHAT DO I DO ABOUT MY DIABETES MEDICATION?  Marland Kitchen Do not take oral diabetes medicines (pills) the morning of surgery.  . THE DAY BEFORE SURGERY, take only your morning/lunch dose of Glipizide. And your usual dose of Actos         Reviewed and Endorsed by [facility] Patient Education Committee, August 2015    [facility] - Preparing for Surgery Before surgery, you can play an important role.  Because skin is not sterile, your skin needs to be as free of germs as possible.  You can reduce the number of germs on your skin by washing with CHG (chlorahexidine gluconate) soap before surgery.  CHG is an antiseptic cleaner which kills germs and bonds with the skin to continue killing germs even after washing. Please DO NOT use if you have an allergy to CHG or antibacterial soaps.  If your skin becomes reddened/irritated stop using the CHG and inform your nurse when you arrive at Short Stay. Do not shave (including legs  and underarms) for at least 48 hours prior to the first CHG shower.  You may shave your face/neck. Please follow these instructions carefully:  1.  Shower with CHG Soap the night before surgery and the  morning of Surgery.  2.  If you choose to wash your hair, wash your hair first as usual with your  normal  shampoo.  3.  After you shampoo, rinse your hair and body thoroughly to remove the  shampoo.                           4.  Use CHG as you would any other liquid soap.  You can apply chg directly  to the skin and wash                       Gently with a scrungie or clean washcloth.  5.  Apply the CHG Soap to your body ONLY FROM THE NECK DOWN.   Do not use on face/ open  Wound or open sores. Avoid contact with eyes, ears mouth and genitals (private parts).                       Wash face,  Genitals (private parts) with your normal soap.             6.  Wash thoroughly, paying special attention to the area where your surgery  will be performed.  7.  Thoroughly rinse your body with warm water from the neck down.  8.  DO NOT shower/wash with your normal soap after using and rinsing off  the CHG Soap.                9.  Pat yourself dry with a clean towel.            10.  Wear clean pajamas.            11.  Place clean sheets on your bed the night of your first shower and do not  sleep with pets. Day of Surgery : Do not apply any lotions/deodorants the morning of surgery.  Please wear clean clothes to the hospital/surgery center.  FAILURE TO FOLLOW THESE INSTRUCTIONS MAY RESULT IN THE CANCELLATION OF YOUR SURGERY PATIENT SIGNATURE_________________________________  NURSE SIGNATURE__________________________________  ________________________________________________________________________  Adam Phenix  An incentive spirometer is a tool that can help keep your lungs clear and active. This tool measures how well you are filling your lungs with each breath. Taking  long deep breaths may help reverse or decrease the chance of developing breathing (pulmonary) problems (especially infection) following:  A long period of time when you are unable to move or be active. BEFORE THE PROCEDURE   If the spirometer includes an indicator to show your best effort, your nurse or respiratory therapist will set it to a desired goal.  If possible, sit up straight or lean slightly forward. Try not to slouch.  Hold the incentive spirometer in an upright position. INSTRUCTIONS FOR USE  1. Sit on the edge of your bed if possible, or sit up as far as you can in bed or on a chair. 2. Hold the incentive spirometer in an upright position. 3. Breathe out normally. 4. Place the mouthpiece in your mouth and seal your lips tightly around it. 5. Breathe in slowly and as deeply as possible, raising the piston or the ball toward the top of the column. 6. Hold your breath for 3-5 seconds or for as long as possible. Allow the piston or ball to fall to the bottom of the column. 7. Remove the mouthpiece from your mouth and breathe out normally. 8. Rest for a few seconds and repeat Steps 1 through 7 at least 10 times every 1-2 hours when you are awake. Take your time and take a few normal breaths between deep breaths. 9. The spirometer may include an indicator to show your best effort. Use the indicator as a goal to work toward during each repetition. 10. After each set of 10 deep breaths, practice coughing to be sure your lungs are clear. If you have an incision (the cut made at the time of surgery), support your incision when coughing by placing a pillow or rolled up towels firmly against it. Once you are able to get out of bed, walk around indoors and cough well. You may stop using the incentive spirometer when instructed by your caregiver.  RISKS AND COMPLICATIONS  Take your time so you do not get dizzy  or light-headed.  If you are in pain, you may need to take or ask for pain  medication before doing incentive spirometry. It is harder to take a deep breath if you are having pain. AFTER USE  Rest and breathe slowly and easily.  It can be helpful to keep track of a log of your progress. Your caregiver can provide you with a simple table to help with this. If you are using the spirometer at home, follow these instructions: [place] IF:   You are having difficultly using the spirometer.  You have trouble using the spirometer as often as instructed.  Your pain medication is not giving enough relief while using the spirometer.  You develop fever of 100.5 F (38.1 C) or higher. SEEK IMMEDIATE MEDICAL CARE IF:   You cough up bloody sputum that had not been present before.  You develop fever of 102 F (38.9 C) or greater.  You develop worsening pain at or near the incision site. MAKE SURE YOU:   Understand these instructions.  Will watch your condition.  Will get help right away if you are not doing well or get worse. Document Released: 04/06/2007 Document Revised: 02/16/2012 Document Reviewed: 06/07/2007 Exit[employer] Patient Information 2014 Exit[employer], [place].   ________________________________________________________________________  WHAT IS A BLOOD TRANSFUSION? Blood Transfusion Information  A transfusion is the replacement of blood or some of its parts. Blood is made up of multiple cells which provide different functions.  Red blood cells carry oxygen and are used for blood loss replacement.  White blood cells fight against infection.  Platelets control bleeding.  Plasma helps clot blood.  Other blood products are available for specialized needs, such as hemophilia or other clotting disorders. BEFORE THE TRANSFUSION  Who gives blood for transfusions?   Healthy volunteers who are fully evaluated to make sure their blood is safe. This is blood bank blood. Transfusion therapy is the safest it has ever been in the practice of medicine.  Before blood is taken from a donor, a complete history is taken to make sure that person has no history of diseases nor engages in risky social behavior (examples are intravenous drug use or sexual activity with multiple partners). The donor's travel history is screened to minimize risk of transmitting infections, such as malaria. The donated blood is tested for signs of infectious diseases, such as HIV and hepatitis. The blood is then tested to be sure it is compatible with you in order to minimize the chance of a transfusion reaction. If you or a relative donates blood, this is often done in anticipation of surgery and is not appropriate for emergency situations. It takes many days to process the donated blood. RISKS AND COMPLICATIONS Although transfusion therapy is very safe and saves many lives, the main dangers of transfusion include:   Getting an infectious disease.  Developing a transfusion reaction. This is an allergic reaction to something in the blood you were given. Every precaution is taken to prevent this. The decision to have a blood transfusion has been considered carefully by your caregiver before blood is given. Blood is not given unless the benefits outweigh the risks. AFTER THE TRANSFUSION  Right after receiving a blood transfusion, you will usually feel much better and more energetic. This is especially true if your red blood cells have gotten low (anemic). The transfusion raises the level of the red blood cells which carry oxygen, and this usually causes an energy increase.  The nurse administering the transfusion will monitor  you carefully for complications. HOME CARE INSTRUCTIONS  No special instructions are needed after a transfusion. You may find your energy is better. Speak with your caregiver about any limitations on activity for underlying diseases you may have. SEEK MEDICAL CARE IF:   Your condition is not improving after your transfusion.  You develop redness or  irritation at the intravenous (IV) site. SEEK IMMEDIATE MEDICAL CARE IF:  Any of the following symptoms occur over the next 12 hours:  Shaking chills.  You have a temperature by mouth above 102 F (38.9 C), not controlled by medicine.  Chest, back, or muscle pain.  People around you feel you are not acting correctly or are confused.  Shortness of breath or difficulty breathing.  Dizziness and fainting.  You get a rash or develop hives.  You have a decrease in urine output.  Your urine turns a dark color or changes to pink, red, or brown. Any of the following symptoms occur over the next 10 days:  You have a temperature by mouth above 102 F (38.9 C), not controlled by medicine.  Shortness of breath.  Weakness after normal activity.  The white part of the eye turns yellow (jaundice).  You have a decrease in the amount of urine or are urinating less often.  Your urine turns a dark color or changes to pink, red, or brown. Document Released: 11/21/2000 Document Revised: 02/16/2012 Document Reviewed: 07/10/2008 [facility] Patient Information 2014 [place], [place].  _______________________________________________________________________

## 2018-12-28 ENCOUNTER — Other Ambulatory Visit: Payer: Self-pay

## 2018-12-28 ENCOUNTER — Encounter (HOSPITAL_COMMUNITY)
Admission: RE | Admit: 2018-12-28 | Discharge: 2018-12-28 | Disposition: A | Discharge status: Home / Self Care | Payer: Medicare Other | Source: Ambulatory Visit | Attending: Orthopedic Surgery | Admitting: Orthopedic Surgery

## 2018-12-28 ENCOUNTER — Other Ambulatory Visit: Payer: Self-pay | Admitting: Orthopaedic Surgery

## 2018-12-28 ENCOUNTER — Encounter (HOSPITAL_COMMUNITY): Payer: Self-pay

## 2018-12-28 ENCOUNTER — Ambulatory Visit (HOSPITAL_COMMUNITY)
Admission: RE | Admit: 2018-12-28 | Discharge: 2018-12-28 | Disposition: A | Discharge status: Home / Self Care | Payer: Medicare Other | Source: Ambulatory Visit | Attending: Orthopedic Surgery | Admitting: Orthopedic Surgery

## 2018-12-28 DIAGNOSIS — Z01818 Encounter for other preprocedural examination: Secondary | ICD-10-CM | POA: Insufficient documentation

## 2018-12-28 DIAGNOSIS — R911 Solitary pulmonary nodule: Secondary | ICD-10-CM | POA: Diagnosis not present

## 2018-12-28 LAB — CBC WITH DIFFERENTIAL/PLATELET
Abs Immature Granulocytes: 0.02 10*3/uL (ref 0.00–0.07)
Basophils Absolute: 0 10*3/uL (ref 0.0–0.1)
Basophils Relative: 1 %
Eosinophils Absolute: 0.1 10*3/uL (ref 0.0–0.5)
Eosinophils Relative: 2 %
HCT: 41.6 % (ref 36.0–46.0)
Hemoglobin: 13 g/dL (ref 12.0–15.0)
IMMATURE GRANULOCYTES: 0 %
Lymphocytes Relative: 26 %
Lymphs Abs: 1.7 10*3/uL (ref 0.7–4.0)
MCH: 29.5 pg (ref 26.0–34.0)
MCHC: 31.3 g/dL (ref 30.0–36.0)
MCV: 94.5 fL (ref 80.0–100.0)
Monocytes Absolute: 0.6 10*3/uL (ref 0.1–1.0)
Monocytes Relative: 8 %
NEUTROS ABS: 4.3 10*3/uL (ref 1.7–7.7)
NEUTROS PCT: 63 %
Platelets: 317 10*3/uL (ref 150–400)
RBC: 4.4 MIL/uL (ref 3.87–5.11)
RDW: 15 % (ref 11.5–15.5)
WBC: 6.8 10*3/uL (ref 4.0–10.5)
nRBC: 0 % (ref 0.0–0.2)

## 2018-12-28 LAB — BASIC METABOLIC PANEL
ANION GAP: 9 (ref 5–15)
BUN: 21 mg/dL (ref 8–23)
CO2: 28 mmol/L (ref 22–32)
Calcium: 9.7 mg/dL (ref 8.9–10.3)
Chloride: 100 mmol/L (ref 98–111)
Creatinine, Ser: 1 mg/dL (ref 0.44–1.00)
GFR calc Af Amer: >60 mL/min (ref >60)
GFR calc non Af Amer: 55 mL/min — ABNORMAL LOW (ref >60)
Glucose, Bld: 169 mg/dL — ABNORMAL HIGH (ref 70–99)
Potassium: 4.8 mmol/L (ref 3.5–5.1)
Sodium: 137 mmol/L (ref 135–145)

## 2018-12-28 LAB — URINALYSIS, ROUTINE W REFLEX MICROSCOPIC
Bilirubin Urine: NEGATIVE
Glucose, UA: NEGATIVE mg/dL
Hgb urine dipstick: NEGATIVE
Ketones, ur: NEGATIVE mg/dL
Leukocytes, UA: NEGATIVE
Nitrite: NEGATIVE
PH: 5 (ref 5.0–8.0)
Protein, ur: NEGATIVE mg/dL
Specific Gravity, Urine: 1.017 (ref 1.005–1.030)

## 2018-12-28 LAB — SURGICAL PCR SCREEN
MRSA, PCR: NEGATIVE
Staphylococcus aureus: NEGATIVE

## 2018-12-28 LAB — PROTIME-INR
INR: 0.95
Prothrombin Time: 12.6 seconds (ref 11.4–15.2)

## 2018-12-28 LAB — HEMOGLOBIN A1C
Hgb A1c MFr Bld: 6.4 % — ABNORMAL HIGH (ref 4.8–5.6)
Mean Plasma Glucose: 136.98 mg/dL

## 2018-12-28 LAB — APTT: aPTT: 32 seconds (ref 24–36)

## 2018-12-28 LAB — GLUCOSE, CAPILLARY: Glucose-Capillary: 156 mg/dL — ABNORMAL HIGH (ref 70–99)

## 2018-12-29 LAB — ABO/RH: ABO/RH(D): A POS

## 2018-12-29 NOTE — Progress Notes (Signed)
12-28-18  CXR routed to Dr. Berenice Primas for review

## 2018-12-29 NOTE — Progress Notes (Signed)
PCP: Dr. Vassie Loll [place]  CARDIOLOGIST: None   INFO IN Epic: 02-22-18 (EKG), and Labs  INFO ON CHART:  BLOOD THINNERS AND LAST DOSES: 325 mg ASA Last dose on 12-27-18 ____________________________________  PATIENT SYMPTOMS AT TIME OF PREOP: None  Hx of DM, and HTN

## 2018-12-29 NOTE — Progress Notes (Signed)
Pt made aware that her new surgery time is 13:55 pm, and that she is to report to admitting at 11:25 am. Pt can have a Clear Liquid Diet from Midnight until 7:55 AM. Pt verbalized understanding

## 2018-12-30 ENCOUNTER — Other Ambulatory Visit (HOSPITAL_COMMUNITY): Payer: Self-pay | Admitting: Orthopedic Surgery

## 2018-12-30 ENCOUNTER — Other Ambulatory Visit: Payer: Self-pay | Admitting: Orthopedic Surgery

## 2018-12-30 DIAGNOSIS — R9389 Abnormal findings on diagnostic imaging of other specified body structures: Secondary | ICD-10-CM

## 2019-01-02 MED ORDER — TRANEXAMIC ACID 1000 MG/10ML IV SOLN
2000.0000 mg | INTRAVENOUS | Status: DC
  Filled 2019-01-02: qty 20

## 2019-01-02 MED ORDER — BUPIVACAINE LIPOSOME 1.3 % IJ SUSP
10.0000 mL | Freq: Once | INTRAMUSCULAR | Status: DC
  Filled 2019-01-02: qty 10

## 2019-01-03 ENCOUNTER — Encounter (HOSPITAL_COMMUNITY)
Admission: RE | Disposition: A | Discharge status: Home Health | Payer: Self-pay | Source: Home / Self Care | Attending: Orthopedic Surgery

## 2019-01-03 ENCOUNTER — Inpatient Hospital Stay (HOSPITAL_COMMUNITY): Payer: Medicare Other | Admitting: Certified Registered Nurse Anesthetist

## 2019-01-03 ENCOUNTER — Inpatient Hospital Stay (HOSPITAL_COMMUNITY): Payer: Medicare Other

## 2019-01-03 ENCOUNTER — Other Ambulatory Visit: Payer: Self-pay

## 2019-01-03 ENCOUNTER — Inpatient Hospital Stay (HOSPITAL_COMMUNITY)
Admission: RE | Admit: 2019-01-03 | Discharge: 2019-01-04 | DRG: 468 | Disposition: A | Discharge status: Home Health | Payer: Medicare Other | Attending: Orthopedic Surgery | Admitting: Orthopedic Surgery

## 2019-01-03 ENCOUNTER — Inpatient Hospital Stay (HOSPITAL_COMMUNITY): Payer: Medicare Other | Admitting: Physician Assistant

## 2019-01-03 ENCOUNTER — Encounter (HOSPITAL_COMMUNITY): Payer: Self-pay | Admitting: Certified Registered Nurse Anesthetist

## 2019-01-03 DIAGNOSIS — M1990 Unspecified osteoarthritis, unspecified site: Secondary | ICD-10-CM | POA: Diagnosis not present

## 2019-01-03 DIAGNOSIS — Z96649 Presence of unspecified artificial hip joint: Secondary | ICD-10-CM | POA: Diagnosis present

## 2019-01-03 DIAGNOSIS — M25551 Pain in right hip: Secondary | ICD-10-CM | POA: Diagnosis present

## 2019-01-03 DIAGNOSIS — E785 Hyperlipidemia, unspecified: Secondary | ICD-10-CM | POA: Diagnosis present

## 2019-01-03 DIAGNOSIS — Z8249 Family history of ischemic heart disease and other diseases of the circulatory system: Secondary | ICD-10-CM

## 2019-01-03 DIAGNOSIS — E119 Type 2 diabetes mellitus without complications: Secondary | ICD-10-CM | POA: Diagnosis not present

## 2019-01-03 DIAGNOSIS — K219 Gastro-esophageal reflux disease without esophagitis: Secondary | ICD-10-CM | POA: Diagnosis not present

## 2019-01-03 DIAGNOSIS — M25351 Other instability, right hip: Secondary | ICD-10-CM | POA: Diagnosis not present

## 2019-01-03 DIAGNOSIS — Z01818 Encounter for other preprocedural examination: Secondary | ICD-10-CM

## 2019-01-03 DIAGNOSIS — Z419 Encounter for procedure for purposes other than remedying health state, unspecified: Secondary | ICD-10-CM

## 2019-01-03 DIAGNOSIS — I10 Essential (primary) hypertension: Secondary | ICD-10-CM | POA: Diagnosis not present

## 2019-01-03 DIAGNOSIS — M1611 Unilateral primary osteoarthritis, right hip: Principal | ICD-10-CM | POA: Diagnosis present

## 2019-01-03 DIAGNOSIS — T8484XA Pain due to internal orthopedic prosthetic devices, implants and grafts, initial encounter: Secondary | ICD-10-CM | POA: Diagnosis not present

## 2019-01-03 DIAGNOSIS — Z96641 Presence of right artificial hip joint: Secondary | ICD-10-CM | POA: Diagnosis not present

## 2019-01-03 DIAGNOSIS — Z471 Aftercare following joint replacement surgery: Secondary | ICD-10-CM | POA: Diagnosis not present

## 2019-01-03 HISTORY — DX: Presence of unspecified artificial hip joint: Z96.649

## 2019-01-03 HISTORY — PX: TOTAL HIP ARTHROPLASTY: SHX124

## 2019-01-03 LAB — POCT I-STAT 4, (NA,K, GLUC, HGB,HCT)
Glucose, Bld: 152 mg/dL — ABNORMAL HIGH (ref 70–99)
HCT: 33 % — ABNORMAL LOW (ref 36.0–46.0)
Hemoglobin: 11.2 g/dL — ABNORMAL LOW (ref 12.0–15.0)
Potassium: 4.1 mmol/L (ref 3.5–5.1)
Sodium: 138 mmol/L (ref 135–145)

## 2019-01-03 LAB — GLUCOSE, CAPILLARY
Glucose-Capillary: 154 mg/dL — ABNORMAL HIGH (ref 70–99)
Glucose-Capillary: 148 mg/dL — ABNORMAL HIGH (ref 70–99)
Glucose-Capillary: 308 mg/dL — ABNORMAL HIGH (ref 70–99)

## 2019-01-03 LAB — TYPE AND SCREEN
ABO/RH(D): A POS
Antibody Screen: NEGATIVE

## 2019-01-03 SURGERY — ARTHROPLASTY, HIP, TOTAL, ANTERIOR APPROACH
Anesthesia: Spinal | Site: Hip | Laterality: Right

## 2019-01-03 MED ORDER — PHENOL 1.4 % MT LIQD: 1.0000 | OROMUCOSAL | Status: DC | PRN

## 2019-01-03 MED ORDER — OXYCODONE-ACETAMINOPHEN 5-325 MG PO TABS: 1.0000 | ORAL_TABLET | ORAL | 0 refills | Status: DC | PRN

## 2019-01-03 MED ORDER — PANTOPRAZOLE SODIUM 40 MG PO TBEC
40.0000 mg | DELAYED_RELEASE_TABLET | Freq: Every day | ORAL | Status: DC
  Administered 2019-01-04: 40 mg via ORAL
  Filled 2019-01-03: qty 1

## 2019-01-03 MED ORDER — HYDROMORPHONE HCL 1 MG/ML IJ SOLN
0.5000 mg | INTRAMUSCULAR | Status: DC | PRN
  Administered 2019-01-04: 0.5 mg via INTRAVENOUS
  Filled 2019-01-03: qty 1

## 2019-01-03 MED ORDER — HYDROMORPHONE HCL 1 MG/ML IJ SOLN
0.2500 mg | INTRAMUSCULAR | Status: DC | PRN
  Administered 2019-01-03 (×2): 0.5 mg via INTRAVENOUS

## 2019-01-03 MED ORDER — GABAPENTIN 300 MG PO CAPS
300.0000 mg | ORAL_CAPSULE | Freq: Three times a day (TID) | ORAL | Status: DC
  Administered 2019-01-03 – 2019-01-04 (×2): 300 mg via ORAL
  Filled 2019-01-03 (×2): qty 1

## 2019-01-03 MED ORDER — PHENYLEPHRINE 40 MCG/ML (10ML) SYRINGE FOR IV PUSH (FOR BLOOD PRESSURE SUPPORT)
PREFILLED_SYRINGE | INTRAVENOUS | Status: AC
  Filled 2019-01-03: qty 20

## 2019-01-03 MED ORDER — PROPOFOL 10 MG/ML IV BOLUS
INTRAVENOUS | Status: DC | PRN
  Administered 2019-01-03 (×3): 20 mg via INTRAVENOUS

## 2019-01-03 MED ORDER — FENTANYL CITRATE (PF) 100 MCG/2ML IJ SOLN
INTRAMUSCULAR | Status: DC | PRN
  Administered 2019-01-03: 100 ug via INTRAVENOUS

## 2019-01-03 MED ORDER — FENTANYL CITRATE (PF) 100 MCG/2ML IJ SOLN
INTRAMUSCULAR | Status: AC
  Filled 2019-01-03: qty 2

## 2019-01-03 MED ORDER — CEFAZOLIN SODIUM-DEXTROSE 2-4 GM/100ML-% IV SOLN
2.0000 g | INTRAVENOUS | Status: AC
  Administered 2019-01-03: 2 g via INTRAVENOUS
  Filled 2019-01-03: qty 100

## 2019-01-03 MED ORDER — BUPIVACAINE LIPOSOME 1.3 % IJ SUSP
INTRAMUSCULAR | Status: DC | PRN
  Administered 2019-01-03: 10 mL

## 2019-01-03 MED ORDER — LIDOCAINE 2% (20 MG/ML) 5 ML SYRINGE
INTRAMUSCULAR | Status: AC
  Filled 2019-01-03: qty 5

## 2019-01-03 MED ORDER — ALBUMIN HUMAN 5 % IV SOLN
INTRAVENOUS | Status: AC
  Filled 2019-01-03: qty 250

## 2019-01-03 MED ORDER — METHOCARBAMOL 500 MG PO TABS: 500.0000 mg | ORAL_TABLET | Freq: Four times a day (QID) | ORAL | Status: DC | PRN

## 2019-01-03 MED ORDER — ONDANSETRON HCL 4 MG/2ML IJ SOLN
INTRAMUSCULAR | Status: DC | PRN
  Administered 2019-01-03: 4 mg via INTRAVENOUS

## 2019-01-03 MED ORDER — ONDANSETRON HCL 4 MG/2ML IJ SOLN: 4.0000 mg | Freq: Four times a day (QID) | INTRAMUSCULAR | Status: DC | PRN

## 2019-01-03 MED ORDER — SODIUM CHLORIDE 0.9 % IR SOLN
Status: DC | PRN
  Administered 2019-01-03: 1000 mL

## 2019-01-03 MED ORDER — MELATONIN 3 MG PO TABS
3.0000 mg | ORAL_TABLET | Freq: Every evening | ORAL | Status: DC | PRN
  Administered 2019-01-03: 3 mg via ORAL
  Filled 2019-01-03 (×3): qty 1

## 2019-01-03 MED ORDER — KCL IN DEXTROSE-NACL 20-5-0.45 MEQ/L-%-% IV SOLN
INTRAVENOUS | Status: DC
  Administered 2019-01-03 – 2019-01-04 (×2): via INTRAVENOUS
  Filled 2019-01-03 (×4): qty 1000

## 2019-01-03 MED ORDER — ONDANSETRON HCL 4 MG PO TABS: 4.0000 mg | ORAL_TABLET | Freq: Four times a day (QID) | ORAL | Status: DC | PRN

## 2019-01-03 MED ORDER — TIZANIDINE HCL 2 MG PO TABS: 2.0000 mg | ORAL_TABLET | Freq: Four times a day (QID) | ORAL | 0 refills | Status: DC | PRN

## 2019-01-03 MED ORDER — METHOCARBAMOL 500 MG IVPB - SIMPLE MED
INTRAVENOUS | Status: AC
  Administered 2019-01-03: 500 mg via INTRAVENOUS
  Filled 2019-01-03: qty 50

## 2019-01-03 MED ORDER — DEXAMETHASONE SODIUM PHOSPHATE 10 MG/ML IJ SOLN
INTRAMUSCULAR | Status: DC | PRN
  Administered 2019-01-03: 10 mg via INTRAVENOUS

## 2019-01-03 MED ORDER — BUPIVACAINE IN DEXTROSE 0.75-8.25 % IT SOLN
INTRATHECAL | Status: DC | PRN
  Administered 2019-01-03: 1.6 mL via INTRATHECAL

## 2019-01-03 MED ORDER — METOCLOPRAMIDE HCL 5 MG/ML IJ SOLN: 5.0000 mg | Freq: Three times a day (TID) | INTRAMUSCULAR | Status: DC | PRN

## 2019-01-03 MED ORDER — ZOLPIDEM TARTRATE 5 MG PO TABS: 5.0000 mg | ORAL_TABLET | Freq: Every evening | ORAL | Status: DC | PRN

## 2019-01-03 MED ORDER — DEXAMETHASONE SODIUM PHOSPHATE 10 MG/ML IJ SOLN
INTRAMUSCULAR | Status: AC
  Filled 2019-01-03: qty 1

## 2019-01-03 MED ORDER — ALBUMIN HUMAN 5 % IV SOLN
INTRAVENOUS | Status: DC | PRN
  Administered 2019-01-03 (×2): via INTRAVENOUS

## 2019-01-03 MED ORDER — ACETAMINOPHEN 500 MG PO TABS
1000.0000 mg | ORAL_TABLET | Freq: Four times a day (QID) | ORAL | Status: DC
  Administered 2019-01-03 – 2019-01-04 (×3): 1000 mg via ORAL
  Filled 2019-01-03 (×4): qty 2

## 2019-01-03 MED ORDER — ACETAMINOPHEN 10 MG/ML IV SOLN
1000.0000 mg | Freq: Once | INTRAVENOUS | Status: DC | PRN
  Administered 2019-01-03: 1000 mg via INTRAVENOUS

## 2019-01-03 MED ORDER — GLIPIZIDE ER 5 MG PO TB24
5.0000 mg | ORAL_TABLET | ORAL | Status: DC
  Filled 2019-01-03: qty 1

## 2019-01-03 MED ORDER — PROPOFOL 10 MG/ML IV BOLUS
INTRAVENOUS | Status: AC
  Filled 2019-01-03: qty 20

## 2019-01-03 MED ORDER — ONDANSETRON HCL 4 MG/2ML IJ SOLN
INTRAMUSCULAR | Status: AC
  Filled 2019-01-03: qty 2

## 2019-01-03 MED ORDER — ASPIRIN EC 325 MG PO TBEC: 325.0000 mg | DELAYED_RELEASE_TABLET | Freq: Two times a day (BID) | ORAL | 0 refills | Status: DC

## 2019-01-03 MED ORDER — STERILE WATER FOR IRRIGATION IR SOLN
Status: DC | PRN
  Administered 2019-01-03: 2000 mL

## 2019-01-03 MED ORDER — PROPOFOL 10 MG/ML IV BOLUS
INTRAVENOUS | Status: AC
  Filled 2019-01-03: qty 40

## 2019-01-03 MED ORDER — AMLODIPINE BESYLATE 10 MG PO TABS
10.0000 mg | ORAL_TABLET | Freq: Every day | ORAL | Status: DC
  Filled 2019-01-03: qty 1

## 2019-01-03 MED ORDER — DIPHENHYDRAMINE HCL 12.5 MG/5ML PO ELIX: 12.5000 mg | ORAL_SOLUTION | ORAL | Status: DC | PRN

## 2019-01-03 MED ORDER — INSULIN ASPART 100 UNIT/ML ~~LOC~~ SOLN
0.0000 [IU] | Freq: Three times a day (TID) | SUBCUTANEOUS | Status: DC
  Administered 2019-01-04: 8 [IU] via SUBCUTANEOUS
  Administered 2019-01-04: 3 [IU] via SUBCUTANEOUS

## 2019-01-03 MED ORDER — CHLORHEXIDINE GLUCONATE 4 % EX LIQD: 60.0000 mL | Freq: Once | CUTANEOUS | Status: DC

## 2019-01-03 MED ORDER — PROMETHAZINE HCL 25 MG/ML IJ SOLN: 6.2500 mg | INTRAMUSCULAR | Status: DC | PRN

## 2019-01-03 MED ORDER — MEPERIDINE HCL 50 MG/ML IJ SOLN
INTRAMUSCULAR | Status: AC
  Administered 2019-01-03: 12.5 mg via INTRAVENOUS
  Filled 2019-01-03: qty 1

## 2019-01-03 MED ORDER — PHENYLEPHRINE 40 MCG/ML (10ML) SYRINGE FOR IV PUSH (FOR BLOOD PRESSURE SUPPORT)
PREFILLED_SYRINGE | INTRAVENOUS | Status: DC | PRN
  Administered 2019-01-03 (×6): 80 ug via INTRAVENOUS

## 2019-01-03 MED ORDER — ASPIRIN 81 MG PO CHEW
81.0000 mg | CHEWABLE_TABLET | Freq: Two times a day (BID) | ORAL | Status: DC
  Administered 2019-01-03 – 2019-01-04 (×2): 81 mg via ORAL
  Filled 2019-01-03 (×2): qty 1

## 2019-01-03 MED ORDER — METOPROLOL TARTRATE 50 MG PO TABS
50.0000 mg | ORAL_TABLET | Freq: Two times a day (BID) | ORAL | Status: DC
  Administered 2019-01-04: 50 mg via ORAL
  Filled 2019-01-03: qty 1

## 2019-01-03 MED ORDER — SODIUM CHLORIDE (PF) 0.9 % IJ SOLN
INTRAMUSCULAR | Status: DC | PRN
  Administered 2019-01-03: 30 mL

## 2019-01-03 MED ORDER — LIDOCAINE HCL (CARDIAC) PF 100 MG/5ML IV SOSY
PREFILLED_SYRINGE | INTRAVENOUS | Status: DC | PRN
  Administered 2019-01-03: 100 mg via INTRAVENOUS

## 2019-01-03 MED ORDER — TRANEXAMIC ACID 1000 MG/10ML IV SOLN
INTRAVENOUS | Status: DC | PRN
  Administered 2019-01-03: 2000 mg via TOPICAL

## 2019-01-03 MED ORDER — DEXAMETHASONE SODIUM PHOSPHATE 10 MG/ML IJ SOLN
10.0000 mg | Freq: Once | INTRAMUSCULAR | Status: AC
  Administered 2019-01-04: 10 mg via INTRAVENOUS
  Filled 2019-01-03: qty 1

## 2019-01-03 MED ORDER — FLEET ENEMA 7-19 GM/118ML RE ENEM: 1.0000 | ENEMA | Freq: Once | RECTAL | Status: DC | PRN

## 2019-01-03 MED ORDER — TRANEXAMIC ACID-NACL 1000-0.7 MG/100ML-% IV SOLN
1000.0000 mg | INTRAVENOUS | Status: AC
  Administered 2019-01-03: 1000 mg via INTRAVENOUS
  Filled 2019-01-03: qty 100

## 2019-01-03 MED ORDER — MENTHOL 3 MG MT LOZG: 1.0000 | LOZENGE | OROMUCOSAL | Status: DC | PRN

## 2019-01-03 MED ORDER — PROPOFOL 10 MG/ML IV BOLUS
INTRAVENOUS | Status: AC
  Filled 2019-01-03: qty 60

## 2019-01-03 MED ORDER — BISACODYL 5 MG PO TBEC: 5.0000 mg | DELAYED_RELEASE_TABLET | Freq: Every day | ORAL | Status: DC | PRN

## 2019-01-03 MED ORDER — CELECOXIB 200 MG PO CAPS
200.0000 mg | ORAL_CAPSULE | Freq: Two times a day (BID) | ORAL | Status: DC
  Administered 2019-01-03 – 2019-01-04 (×2): 200 mg via ORAL
  Filled 2019-01-03 (×2): qty 1

## 2019-01-03 MED ORDER — METOCLOPRAMIDE HCL 5 MG PO TABS: 5.0000 mg | ORAL_TABLET | Freq: Three times a day (TID) | ORAL | Status: DC | PRN

## 2019-01-03 MED ORDER — TRANEXAMIC ACID-NACL 1000-0.7 MG/100ML-% IV SOLN
1000.0000 mg | Freq: Once | INTRAVENOUS | Status: AC
  Administered 2019-01-03: 1000 mg via INTRAVENOUS
  Filled 2019-01-03: qty 100

## 2019-01-03 MED ORDER — SODIUM CHLORIDE 0.9 % IV SOLN
INTRAVENOUS | Status: DC | PRN
  Administered 2019-01-03: 50 ug/min via INTRAVENOUS

## 2019-01-03 MED ORDER — ACETAMINOPHEN 325 MG PO TABS
325.0000 mg | ORAL_TABLET | Freq: Four times a day (QID) | ORAL | Status: DC | PRN
  Filled 2019-01-03: qty 2

## 2019-01-03 MED ORDER — PHENYLEPHRINE HCL 10 MG/ML IJ SOLN
INTRAMUSCULAR | Status: AC
  Filled 2019-01-03: qty 1

## 2019-01-03 MED ORDER — HYDROMORPHONE HCL 1 MG/ML IJ SOLN
INTRAMUSCULAR | Status: AC
  Administered 2019-01-03: 0.5 mg via INTRAVENOUS
  Filled 2019-01-03: qty 1

## 2019-01-03 MED ORDER — METHOCARBAMOL 500 MG IVPB - SIMPLE MED
500.0000 mg | Freq: Four times a day (QID) | INTRAVENOUS | Status: DC | PRN
  Administered 2019-01-03: 500 mg via INTRAVENOUS
  Filled 2019-01-03: qty 50

## 2019-01-03 MED ORDER — POLYETHYLENE GLYCOL 3350 17 G PO PACK: 17.0000 g | PACK | Freq: Every day | ORAL | Status: DC | PRN

## 2019-01-03 MED ORDER — LACTATED RINGERS IV SOLN
INTRAVENOUS | Status: DC
  Administered 2019-01-03 (×2): via INTRAVENOUS

## 2019-01-03 MED ORDER — BUPIVACAINE-EPINEPHRINE (PF) 0.25% -1:200000 IJ SOLN
INTRAMUSCULAR | Status: AC
  Filled 2019-01-03: qty 30

## 2019-01-03 MED ORDER — ALUM & MAG HYDROXIDE-SIMETH 200-200-20 MG/5ML PO SUSP: 30.0000 mL | ORAL | Status: DC | PRN

## 2019-01-03 MED ORDER — PIOGLITAZONE HCL 30 MG PO TABS
30.0000 mg | ORAL_TABLET | Freq: Every day | ORAL | Status: DC
  Administered 2019-01-04: 30 mg via ORAL
  Filled 2019-01-03: qty 1

## 2019-01-03 MED ORDER — EPHEDRINE SULFATE-NACL 50-0.9 MG/10ML-% IV SOSY
PREFILLED_SYRINGE | INTRAVENOUS | Status: DC | PRN
  Administered 2019-01-03: 10 mg via INTRAVENOUS

## 2019-01-03 MED ORDER — ACETAMINOPHEN 10 MG/ML IV SOLN
INTRAVENOUS | Status: AC
  Filled 2019-01-03: qty 100

## 2019-01-03 MED ORDER — MEPERIDINE HCL 50 MG/ML IJ SOLN
6.2500 mg | INTRAMUSCULAR | Status: DC | PRN
  Administered 2019-01-03: 12.5 mg via INTRAVENOUS

## 2019-01-03 MED ORDER — DOCUSATE SODIUM 100 MG PO CAPS
100.0000 mg | ORAL_CAPSULE | Freq: Two times a day (BID) | ORAL | Status: DC
  Administered 2019-01-03 – 2019-01-04 (×2): 100 mg via ORAL
  Filled 2019-01-03 (×2): qty 1

## 2019-01-03 MED ORDER — BUPIVACAINE-EPINEPHRINE (PF) 0.25% -1:200000 IJ SOLN
INTRAMUSCULAR | Status: DC | PRN
  Administered 2019-01-03: 30 mL

## 2019-01-03 MED ORDER — OXYCODONE HCL 5 MG PO TABS
5.0000 mg | ORAL_TABLET | ORAL | Status: DC | PRN
  Administered 2019-01-03 (×2): 5 mg via ORAL
  Administered 2019-01-04 (×2): 10 mg via ORAL
  Filled 2019-01-03 (×3): qty 2

## 2019-01-03 MED ORDER — PROPOFOL 500 MG/50ML IV EMUL
INTRAVENOUS | Status: DC | PRN
  Administered 2019-01-03: 150 ug/kg/min via INTRAVENOUS

## 2019-01-03 MED ORDER — LISINOPRIL 20 MG PO TABS
40.0000 mg | ORAL_TABLET | Freq: Every day | ORAL | Status: DC
  Filled 2019-01-03: qty 2

## 2019-01-03 SURGICAL SUPPLY — 53 items
APL SKNCLS STERI-STRIP NONHPOA (GAUZE/BANDAGES/DRESSINGS) ×1
BAG SPEC THK2 15X12 ZIP CLS (MISCELLANEOUS) ×1
BAG ZIPLOCK 12X15 (MISCELLANEOUS) ×2 IMPLANT
BENZOIN TINCTURE PRP APPL 2/3 (GAUZE/BANDAGES/DRESSINGS) ×3 IMPLANT
BLADE SAW SGTL 18X1.27X75 (BLADE) ×2 IMPLANT
BLADE SAW SGTL 18X1.27X75MM (BLADE) ×1
CELLS DAT CNTRL 66122 CELL SVR (MISCELLANEOUS) ×1 IMPLANT
CLOSURE WOUND 1/2 X4 (GAUZE/BANDAGES/DRESSINGS) ×1
CLOTH BEACON ORANGE TIMEOUT ST (SAFETY) ×3 IMPLANT
COVER PERINEAL POST (MISCELLANEOUS) ×3 IMPLANT
COVER SURGICAL LIGHT HANDLE (MISCELLANEOUS) ×3 IMPLANT
COVER WAND RF STERILE (DRAPES) IMPLANT
CUP ACET PNNCL SECTR W/GRIP 56 (Hips) IMPLANT
CUP ACETABULAR GRIPTON 100 52 (Orthopedic Implant) IMPLANT
DRAPE STERI IOBAN 125X83 (DRAPES) ×3 IMPLANT
DRAPE U-SHAPE 47X51 STRL (DRAPES) ×6 IMPLANT
DRESSING AQUACEL AG SP 3.5X10 (GAUZE/BANDAGES/DRESSINGS) IMPLANT
DRSG AQUACEL AG ADV 3.5X10 (GAUZE/BANDAGES/DRESSINGS) ×3 IMPLANT
DRSG AQUACEL AG SP 3.5X10 (GAUZE/BANDAGES/DRESSINGS) ×3
DURAPREP 26ML APPLICATOR (WOUND CARE) ×3 IMPLANT
ELECT REM PT RETURN 15FT ADLT (MISCELLANEOUS) ×3 IMPLANT
ELIMINATOR HOLE APEX DEPUY (Hips) ×2 IMPLANT
GAUZE XEROFORM 1X8 LF (GAUZE/BANDAGES/DRESSINGS) ×3 IMPLANT
GLOVE BIO SURGEON STRL SZ7.5 (GLOVE) ×8 IMPLANT
GLOVE BIOGEL PI IND STRL 7.5 (GLOVE) IMPLANT
GLOVE BIOGEL PI IND STRL 8 (GLOVE) IMPLANT
GLOVE BIOGEL PI INDICATOR 7.5 (GLOVE) ×6
GLOVE BIOGEL PI INDICATOR 8 (GLOVE) ×4
GLOVE ECLIPSE 7.5 STRL STRAW (GLOVE) ×4 IMPLANT
GLOVE INDICATOR 8.0 STRL GRN (GLOVE) ×8 IMPLANT
GLOVE SURG SS PI 7.5 STRL IVOR (GLOVE) ×2 IMPLANT
GOWN STRL REIN 3XL LVL4 (GOWN DISPOSABLE) ×2 IMPLANT
GOWN STRL REUS W/TWL XL LVL3 (GOWN DISPOSABLE) ×6 IMPLANT
GRIPTON 100 52 (Orthopedic Implant) ×3 IMPLANT
HANDPIECE INTERPULSE COAX TIP (DISPOSABLE) ×3
HEAD M SROM 36MM PLUS 1.5 (Hips) IMPLANT
HOLDER FOLEY CATH W/STRAP (MISCELLANEOUS) ×3 IMPLANT
LINER NEUTRAL 52MMX36MMX56N (Liner) ×2 IMPLANT
PACK ANTERIOR HIP CUSTOM (KITS) ×3 IMPLANT
PINN SECTOR W/GRIP ACE CUP 56 (Hips) ×3 IMPLANT
RETRACTOR WND ALEXIS 18 MED (MISCELLANEOUS) ×1 IMPLANT
RTRCTR WOUND ALEXIS 18CM MED (MISCELLANEOUS) ×3
SCREW PINN CAN BONE 6.5MMX15MM (Screw) ×2 IMPLANT
SET HNDPC FAN SPRY TIP SCT (DISPOSABLE) ×1 IMPLANT
SROM M HEAD 36MM PLUS 1.5 (Hips) ×3 IMPLANT
STAPLER VISISTAT 35W (STAPLE) ×2 IMPLANT
STRIP CLOSURE SKIN 1/2X4 (GAUZE/BANDAGES/DRESSINGS) ×2 IMPLANT
SUT VIC AB 0 CT1 36 (SUTURE) ×3 IMPLANT
SUT VIC AB 1 CT1 36 (SUTURE) ×7 IMPLANT
SUT VIC AB 2-0 CT1 27 (SUTURE) ×3
SUT VIC AB 2-0 CT1 TAPERPNT 27 (SUTURE) ×2 IMPLANT
TRAY FOLEY MTR SLVR 14FR STAT (SET/KITS/TRAYS/PACK) ×2 IMPLANT
YANKAUER SUCT BULB TIP NO VENT (SUCTIONS) ×3 IMPLANT

## 2019-01-03 NOTE — Progress Notes (Signed)
Pt's CBG HS is 308, no HS correction Insuline, MD made aware, no further order at this time.

## 2019-01-03 NOTE — Discharge Instructions (Signed)

## 2019-01-03 NOTE — Anesthesia Procedure Notes (Addendum)
Spinal  Start time: 01/03/2019 2:13 PM End time: 01/03/2019 2:17 PM Staffing Anesthesiologist: Lyn Hollingshead, MD Performed: anesthesiologist  Preanesthetic Checklist Completed: patient identified, site marked, surgical consent, pre-op evaluation, timeout performed, IV checked, risks and benefits discussed and monitors and equipment checked Spinal Block Patient position: sitting Prep: DuraPrep Patient monitoring: heart rate, continuous pulse ox and blood pressure Approach: midline Location: L3-4 Injection technique: single-shot Needle Needle type: Pencan  Needle gauge: 24 G Needle length: 9 cm Needle insertion depth: 6 cm Assessment Sensory level: T6 Additional Notes Expiration date of kit checked and confirmed. Patient tolerated procedure well, without complications.

## 2019-01-03 NOTE — Brief Op Note (Signed)
01/03/2019  4:49 PM  PATIENT:  Marie [name]  77 y.o. female  PRE-OPERATIVE DIAGNOSIS:  PAINFUL RIGHT HEMI HIP  POST-OPERATIVE DIAGNOSIS:  PAINFUL RIGHT HEMI HIP  PROCEDURE:  Procedure(s): CONVERSION FROM HEMI TO RIGHT TOTAL HIP ARTHROPLASTY ANTERIOR APPROACH (Right)  SURGEON:  Surgeon(s) and Role:    Dorna Leitz, MD - Primary  PHYSICIAN ASSISTANT:   ASSISTANTS: jim bethune   ANESTHESIA:   spinal  EBL:  900 mL   BLOOD ADMINISTERED:none  DRAINS: none   LOCAL MEDICATIONS USED:  MARCAINE    and OTHER experel  SPECIMEN:  No Specimen  DISPOSITION OF SPECIMEN:  N/A  COUNTS:  YES  TOURNIQUET:  * No tourniquets in log *  DICTATION: .Other Dictation: Dictation Number [id number]  PLAN OF CARE: Admit to inpatient   PATIENT DISPOSITION:  PACU - hemodynamically stable.   Delay start of Pharmacological VTE agent (>24hrs) due to surgical blood loss or risk of bleeding: no

## 2019-01-03 NOTE — Anesthesia Preprocedure Evaluation (Signed)
Anesthesia Evaluation  Patient identified by MRN, date of birth, ID band Patient awake    Reviewed: Allergy & Precautions, NPO status , Patient's Chart, lab work & pertinent test results  Airway Mallampati: I  TM Distance: >3 FB Neck ROM: Full    Dental  (+) Teeth Intact   Pulmonary    Pulmonary exam normal        Cardiovascular hypertension, Pt. on medications Normal cardiovascular exam     Neuro/Psych    GI/Hepatic GERD  Controlled,  Endo/Other  diabetes, Type 2, Oral Hypoglycemic Agents  Renal/GU      Musculoskeletal   Abdominal (+) + obese,   Peds  Hematology   Anesthesia Other Findings   Reproductive/Obstetrics                             Anesthesia Physical  Anesthesia Plan  ASA: II  Anesthesia Plan: Spinal   Post-op Pain Management:  Regional for Post-op pain   Induction: Intravenous  PONV Risk Score and Plan: 2 and Ondansetron and Treatment may vary due to age or medical condition  Airway Management Planned: Simple Face Mask, Nasal Cannula and Natural Airway  Additional Equipment:   Intra-op Plan:   Post-operative Plan:   Informed Consent: I have reviewed the patients History and Physical, chart, labs and discussed the procedure including the risks, benefits and alternatives for the proposed anesthesia with the patient or authorized representative who has indicated his/her understanding and acceptance.       Plan Discussed with: CRNA  Anesthesia Plan Comments:         Anesthesia Quick Evaluation

## 2019-01-03 NOTE — H&P (Signed)
TOTAL HIP REVISION ADMISSION H&P  Patient is admitted for right revision total hip arthroplasty.  Subjective:  Chief Complaint: right hip pain  HPI: Marie [name], 78 y.o. female, has a history of pain and functional disability in the right hip due to Painful right hip after hemiarthroplasty and patient has failed non-surgical conservative treatments for greater than 12 weeks to include NSAID's and/or analgesics and corticosteriod injections. The indications for the revision total hip arthroplasty are Mechanical pain in the groin after hemiarthroplasty.  Onset of symptoms was gradual starting 1 years ago with gradually worsening course since that time.  Prior procedures on the right hip include hemi-arthroplasty.  Patient currently rates pain in the right hip at 7 out of 10 with activity.  There is night pain, worsening of pain with activity and weight bearing, trendelenberg gait, crepitus and joint swelling. Patient has evidence of joint space narrowing by imaging studies.  This condition presents safety issues increasing the risk of falls.  This patient has had failure of hemi-arthroplasty.  There is no current active infection.  Patient Active Problem List   Diagnosis Date Noted  . Primary osteoarthritis of right knee 02/22/2018  . Osteoarthritis of right knee 02/19/2018  . Type 2 diabetes mellitus, without long-term current use of insulin ([place]) 05/10/2017  . HLD (hyperlipidemia) 05/10/2017  . Essential hypertension 05/10/2017  . Closed displaced fracture of neck of right femur ([place]) 05/10/2017   Past Medical History:  Diagnosis Date  . Arthritis    knees, hands, hips  . Diabetes mellitus without complication ([place])   . GERD (gastroesophageal reflux disease)   . Hyperlipidemia   . Hypertension   . Papilloma of breast    left    Past Surgical History:  Procedure Laterality Date  . ABDOMINAL HYSTERECTOMY    . APPENDECTOMY    . BREAST DUCTAL SYSTEM EXCISION Right 05/01/2016   Procedure: RIGHT CENTRAL DUCT EXCISION;  Surgeon: Erroll Luna, MD;  Location: [facility];  Service: General;  Laterality: Right;  . BREAST LUMPECTOMY WITH RADIOACTIVE SEED LOCALIZATION Left 05/01/2016   Procedure: LEFT BREAST LUMPECTOMY WITH RADIOACTIVE SEED LOCALIZATION;  Surgeon: Erroll Luna, MD;  Location: [facility];  Service: General;  Laterality: Left;  . BREAST SURGERY     left breast biopsy x2  . COLONOSCOPY    . TOTAL KNEE ARTHROPLASTY Right 02/22/2018   Procedure: TOTAL KNEE ARTHROPLASTY;  Surgeon: Frederik Pear, MD;  Location: [facility];  Service: Orthopedics;  Laterality: Right;  Marland Kitchen VARICOSE VEIN SURGERY Left     Current Facility-Administered Medications  Medication Dose Route Frequency Provider Last Rate Last Dose  . bupivacaine liposome (EXPAREL) 1.3 % injection 133 mg  10 mL Infiltration Once Dorna Leitz, MD      . ceFAZolin (ANCEF) IVPB 2g/100 mL premix  2 g Intravenous On Call to OR Frederik Pear, MD      . chlorhexidine (HIBICLENS) 4 % liquid 4 application  60 mL Topical Once Frederik Pear, MD      . lactated ringers infusion   Intravenous Continuous Frederik Pear, MD 75 mL/hr at 01/03/19 1218    . tranexamic acid (CYKLOKAPRON) 2,000 mg in sodium chloride 0.9 % 50 mL Topical Application  6,720 mg Topical To OR Talena Neira, MD      . tranexamic acid (CYKLOKAPRON) IVPB 1,000 mg  1,000 mg Intravenous To OR Frederik Pear, MD       No Known Allergies  Social History   Tobacco Use  .  Smoking status: Never Smoker  . Smokeless tobacco: Never Used  Substance Use Topics  . Alcohol use: No    Family History  Problem Relation Age of Onset  . High blood pressure Mother   . Colon cancer Mother   . Heart attack Father   . Colon cancer Sister   . Pancreatic cancer Sister   . High blood pressure Brother   . Bleeding Disorder Sister   . High blood pressure Brother       ROS ROS: I have reviewed the patient's review of systems thoroughly and there  are no positive responses as relates to the HPI. Objective:  Physical Exam  Vital signs in last 24 hours: Temp:  [98 F (36.7 C)] 98 F (36.7 C) (01/27 1140) Pulse Rate:  [64] 64 (01/27 1140) Resp:  [16] 16 (01/27 1140) BP: (162)/(89) 162/89 (01/27 1140) SpO2:  [100 %] 100 % (01/27 1140) Weight:  [96.2 kg] 96.2 kg (01/27 1138) Well-developed well-nourished patient in no acute distress. Alert and oriented x3 HEENT:within normal limits Cardiac: Regular rate and rhythm Pulmonary: Lungs clear to auscultation Abdomen: Soft and nontender.  Normal active bowel sounds  Musculoskeletal: (Right hip: Painful range of motion.  Limited range of motion.  Pain with internal and external rotation.  Labs: Recent Results (from the past 2160 hour(s))  Urinalysis, Routine w reflex microscopic     Status: None   Collection Time: 12/28/18  1:02 PM  Result Value Ref Range   Color, Urine YELLOW YELLOW   APPearance CLEAR CLEAR   Specific Gravity, Urine 1.017 1.005 - 1.030   pH 5.0 5.0 - 8.0   Glucose, UA NEGATIVE NEGATIVE mg/dL   Hgb urine dipstick NEGATIVE NEGATIVE   Bilirubin Urine NEGATIVE NEGATIVE   Ketones, ur NEGATIVE NEGATIVE mg/dL   Protein, ur NEGATIVE NEGATIVE mg/dL   Nitrite NEGATIVE NEGATIVE   Leukocytes, UA NEGATIVE NEGATIVE    Comment: Performed at [facility] [address], [place], [place] [address]  Glucose, capillary     Status: Abnormal   Collection Time: 12/28/18  1:14 PM  Result Value Ref Range   Glucose-Capillary 156 (H) 70 - 99 mg/dL  APTT     Status: None   Collection Time: 12/28/18  1:49 PM  Result Value Ref Range   aPTT 32 24 - 36 seconds    Comment: Performed at [facility], [place] [address] [place], [place] [address]  Basic metabolic panel     Status: Abnormal   Collection Time: 12/28/18  1:49 PM  Result Value Ref Range   Sodium 137 135 - 145 mmol/L   Potassium 4.8 3.5 - 5.1 mmol/L   Chloride 100 98 - 111 mmol/L   CO2  28 22 - 32 mmol/L   Glucose, Bld 169 (H) 70 - 99 mg/dL   BUN 21 8 - 23 mg/dL   Creatinine, Ser 1.00 0.44 - 1.00 mg/dL   Calcium 9.7 8.9 - 10.3 mg/dL   GFR calc non Af Amer 55 (L) >60 mL/min   GFR calc Af Amer >60 >60 mL/min   Anion gap 9 5 - 15    Comment: Performed at [facility], [place] [address] [place], [place] [address]  CBC WITH DIFFERENTIAL     Status: None   Collection Time: 12/28/18  1:49 PM  Result Value Ref Range   WBC 6.8 4.0 - 10.5 K/uL   RBC 4.40 3.87 - 5.11 MIL/uL   Hemoglobin 13.0 12.0 -  15.0 g/dL   HCT 41.6 36.0 - 46.0 %   MCV 94.5 80.0 - 100.0 fL   MCH 29.5 26.0 - 34.0 pg   MCHC 31.3 30.0 - 36.0 g/dL   RDW 15.0 11.5 - 15.5 %   Platelets 317 150 - 400 K/uL   nRBC 0.0 0.0 - 0.2 %   Neutrophils Relative % 63 %   Neutro Abs 4.3 1.7 - 7.7 K/uL   Lymphocytes Relative 26 %   Lymphs Abs 1.7 0.7 - 4.0 K/uL   Monocytes Relative 8 %   Monocytes Absolute 0.6 0.1 - 1.0 K/uL   Eosinophils Relative 2 %   Eosinophils Absolute 0.1 0.0 - 0.5 K/uL   Basophils Relative 1 %   Basophils Absolute 0.0 0.0 - 0.1 K/uL   Immature Granulocytes 0 %   Abs Immature Granulocytes 0.02 0.00 - 0.07 K/uL    Comment: Performed at [facility], [place] [address], [place], [place] [address]  Protime-INR     Status: None   Collection Time: 12/28/18  1:49 PM  Result Value Ref Range   Prothrombin Time 12.6 11.4 - 15.2 seconds   INR 0.95     Comment: Performed at [facility], [place] [address], [place], [place] [address]  Type and screen Order type and screen if day of surgery is less than 15 days from draw of preadmission visit or order morning of surgery if day of surgery is greater than 6 days from preadmission visit.     Status: None   Collection Time: 12/28/18  1:49 PM  Result Value Ref Range   ABO/RH(D) A POS    Antibody Screen NEG    Sample Expiration 01/06/2019    Extend sample reason      NO TRANSFUSIONS OR PREGNANCY IN THE PAST 3  MONTHS Performed at [facility], [place] [address] [place], [place] [address]   Surgical pcr screen     Status: None   Collection Time: 12/28/18  1:49 PM  Result Value Ref Range   MRSA, PCR NEGATIVE NEGATIVE   Staphylococcus aureus NEGATIVE NEGATIVE    Comment: (NOTE) The Xpert SA Assay (FDA approved for NASAL specimens in patients 32 years of age and older), is one component of a comprehensive surveillance program. It is not intended to diagnose infection nor to guide or monitor treatment. Performed at [facility], [place] [address], [place], [place] [address]   Hemoglobin A1c     Status: Abnormal   Collection Time: 12/28/18  1:49 PM  Result Value Ref Range   Hgb A1c MFr Bld 6.4 (H) 4.8 - 5.6 %    Comment: (NOTE) Pre diabetes:          5.7%-6.4% Diabetes:              >6.4% Glycemic control for   <7.0% adults with diabetes    Mean Plasma Glucose 136.98 mg/dL    Comment: Performed at [facility] [address], [place], [place] [address]  ABO/Rh     Status: None   Collection Time: 12/28/18  1:49 PM  Result Value Ref Range   ABO/RH(D)      A POS Performed at [facility], [place] [address], [place],  [address]   Glucose, capillary     Status: Abnormal   Collection Time: 01/03/19 11:28 AM  Result Value Ref Range   Glucose-Capillary 154 (H) 70 - 99 mg/dL  Estimated body mass index is 34.22 kg/m as calculated from the following:   Height as of this encounter: 5\' 6"  (1.676 m).   Weight as of this encounter: 96.2 kg.  Imaging Review:  Plain radiographs demonstrate severe degenerative joint disease of the right hip(s). There is evidence of loosening of the No evidence of loosening.  There is just narrowing of the joint space.The bone quality appears to be fair for age and reported activity level. There is narrowing of the joint space.   Preoperative templating of the joint replacement has been completed,  documented, and submitted to the Operating Room personnel in order to optimize intra-operative equipment management.   Assessment/Plan:  End stage arthritis, right hip(s) with failed previous arthroplasty.  The patient history, physical examination, clinical judgement of the provider and imaging studies are consistent with end stage degenerative joint disease of the right hip(s), previous total hip arthroplasty. Revision total hip arthroplasty is deemed medically necessary. The treatment options including medical management, injection therapy, arthroscopy and arthroplasty were discussed at length. The risks and benefits of total hip arthroplasty were presented and reviewed. The risks due to aseptic loosening, infection, stiffness, dislocation/subluxation,  thromboembolic complications and other imponderables were discussed.  The patient acknowledged the explanation, agreed to proceed with the plan and consent was signed. Patient is being admitted for inpatient treatment for surgery, pain control, PT, OT, prophylactic antibiotics, VTE prophylaxis, progressive ambulation and ADL's and discharge planning. The patient is planning to be discharged home with home health services

## 2019-01-03 NOTE — Transfer of Care (Signed)
Immediate Anesthesia Transfer of Care Note  Patient: Marie [name]  Procedure(s) Performed: CONVERSION FROM HEMI TO RIGHT TOTAL HIP ARTHROPLASTY ANTERIOR APPROACH (Right Hip)  Patient Location: PACU  Anesthesia Type:MAC and Spinal  Level of Consciousness: awake, alert , oriented and patient cooperative  Airway & Oxygen Therapy: Patient Spontanous Breathing and Patient connected to face mask oxygen  Post-op Assessment: Report given to RN, Post -op Vital signs reviewed and stable and Patient moving all extremities  Post vital signs: Reviewed and stable  Last Vitals:  Vitals Value Taken Time  BP 136/66 01/03/2019  5:05 PM  Temp    Pulse 76 01/03/2019  5:06 PM  Resp 14 01/03/2019  5:09 PM  SpO2 100 % 01/03/2019  5:06 PM  Vitals shown include unvalidated device data.  Last Pain:  Vitals:   01/03/19 1140  TempSrc: Oral  PainSc:       Patients Stated Pain Goal: 6 (28/41/32 4401)  Complications: No apparent anesthesia complications

## 2019-01-04 ENCOUNTER — Encounter (HOSPITAL_COMMUNITY): Payer: Self-pay | Admitting: Orthopedic Surgery

## 2019-01-04 DIAGNOSIS — Z96649 Presence of unspecified artificial hip joint: Secondary | ICD-10-CM

## 2019-01-04 DIAGNOSIS — M25551 Pain in right hip: Secondary | ICD-10-CM | POA: Diagnosis present

## 2019-01-04 HISTORY — DX: Pain in right hip: M25.551

## 2019-01-04 HISTORY — DX: Presence of unspecified artificial hip joint: Z96.649

## 2019-01-04 LAB — CBC
HEMATOCRIT: 27.7 % — AB (ref 36.0–46.0)
Hemoglobin: 8.9 g/dL — ABNORMAL LOW (ref 12.0–15.0)
MCH: 30.5 pg (ref 26.0–34.0)
MCHC: 32.1 g/dL (ref 30.0–36.0)
MCV: 94.9 fL (ref 80.0–100.0)
Platelets: 188 10*3/uL (ref 150–400)
RBC: 2.92 MIL/uL — ABNORMAL LOW (ref 3.87–5.11)
RDW: 14.6 % (ref 11.5–15.5)
WBC: 9 10*3/uL (ref 4.0–10.5)
nRBC: 0 % (ref 0.0–0.2)

## 2019-01-04 LAB — BASIC METABOLIC PANEL
Anion gap: 6 (ref 5–15)
BUN: 16 mg/dL (ref 8–23)
CHLORIDE: 103 mmol/L (ref 98–111)
CO2: 23 mmol/L (ref 22–32)
Calcium: 8.1 mg/dL — ABNORMAL LOW (ref 8.9–10.3)
Creatinine, Ser: 0.84 mg/dL (ref 0.44–1.00)
GFR calc Af Amer: >60 mL/min (ref >60)
GFR calc non Af Amer: >60 mL/min (ref >60)
Glucose, Bld: 328 mg/dL — ABNORMAL HIGH (ref 70–99)
Potassium: 4.9 mmol/L (ref 3.5–5.1)
Sodium: 132 mmol/L — ABNORMAL LOW (ref 135–145)

## 2019-01-04 LAB — GLUCOSE, CAPILLARY
Glucose-Capillary: 271 mg/dL — ABNORMAL HIGH (ref 70–99)
Glucose-Capillary: 263 mg/dL — ABNORMAL HIGH (ref 70–99)

## 2019-01-04 LAB — HEMOGLOBIN A1C
Hgb A1c MFr Bld: 6.3 % — ABNORMAL HIGH (ref 4.8–5.6)
Mean Plasma Glucose: 134.11 mg/dL

## 2019-01-04 MED ORDER — CEPHALEXIN 500 MG PO CAPS: 500.0000 mg | ORAL_CAPSULE | Freq: Two times a day (BID) | ORAL | 0 refills | Status: AC

## 2019-01-04 NOTE — Anesthesia Postprocedure Evaluation (Signed)
Anesthesia Post Note  Patient: Marie [name]  Procedure(s) Performed: CONVERSION FROM HEMI TO RIGHT TOTAL HIP ARTHROPLASTY ANTERIOR APPROACH (Right Hip)     Patient location during evaluation: PACU Anesthesia Type: Spinal Level of consciousness: awake and sedated Pain management: pain level controlled Vital Signs Assessment: post-procedure vital signs reviewed and stable Respiratory status: spontaneous breathing Cardiovascular status: stable Postop Assessment: no headache, no backache, spinal receding, patient able to bend at knees and no apparent nausea or vomiting Anesthetic complications: no    Last Vitals:  Vitals:   01/04/19 1037 01/04/19 1346  BP: (!) 122/53 (!) 100/52  Pulse: 92 67  Resp: 16 16  Temp: 36.8 C 37 C  SpO2: 100% 96%    Last Pain:  Vitals:   01/04/19 1204  TempSrc:   PainSc: 5    Pain Goal: Patients Stated Pain Goal: 4 (01/04/19 1204)                 Huston Foley

## 2019-01-04 NOTE — Discharge Summary (Signed)
Patient ID: Marie [name] MRN: [id number] DOB/AGE: [birth year] 78 y.o.  Admit date: 01/03/2019 Discharge date: 01/04/2019  Admission Diagnoses:  Active Problems:   History of revision of total hip arthroplasty   Pain in joint of right hip   Status post hip hemiarthroplasty   Discharge Diagnoses:  Same  Past Medical History:  Diagnosis Date  . Arthritis    knees, hands, hips  . Diabetes mellitus without complication ([place])   . GERD (gastroesophageal reflux disease)   . Hyperlipidemia   . Hypertension   . Papilloma of breast    left    Surgeries: Procedure(s): CONVERSION FROM HEMI TO RIGHT TOTAL HIP ARTHROPLASTY ANTERIOR APPROACH on 01/03/2019   Consultants:   Discharged Condition: Improved  Hospital Course: Marie [name] is an 78 y.o. female who was admitted 01/03/2019 for operative treatment of<principal problem not specified>. Patient has severe unremitting pain that affects sleep, daily activities, and work/hobbies. After pre-op clearance the patient was taken to the operating room on 01/03/2019 and underwent  Procedure(s): CONVERSION FROM HEMI TO RIGHT TOTAL HIP ARTHROPLASTY ANTERIOR APPROACH.    Patient was given perioperative antibiotics:  Anti-infectives (From admission, onward)   Start     Dose/Rate Route Frequency Ordered Stop   01/04/19 0000  cephALEXin (KEFLEX) 500 MG capsule     500 mg Oral 2 times daily 01/04/19 1240 01/14/19 2359   01/03/19 1130  ceFAZolin (ANCEF) IVPB 2g/100 mL premix     2 g 200 mL/hr over 30 Minutes Intravenous On call to O.R. 01/03/19 1118 01/03/19 1419       Patient was given sequential compression devices, early ambulation, and chemoprophylaxis to prevent DVT.  Patient benefited maximally from hospital stay and there were no complications.    Recent vital signs:  Patient Vitals for the past 24 hrs:  BP Temp Temp src Pulse Resp SpO2  01/04/19 1037 (!) 122/53 98.3 F (36.8 C) Oral 92 16 100 %  01/04/19 0808 108/62 - - 93 17 99 %   01/04/19 0530 (!) 119/56 98 F (36.7 C) Oral 81 16 100 %  01/04/19 0203 (!) 110/53 98 F (36.7 C) Oral 80 16 99 %  01/03/19 2153 (!) 112/56 97.7 F (36.5 C) Oral 83 - 99 %  01/03/19 2043 (!) 127/52 97.6 F (36.4 C) - 79 - 100 %  01/03/19 1941 (!) 118/54 (!) 97.5 F (36.4 C) - 80 - 100 %  01/03/19 1832 (!) 114/55 97.6 F (36.4 C) Oral 64 14 100 %  01/03/19 1800 (!) 114/54 (!) 97.5 F (36.4 C) - 62 13 100 %  01/03/19 1745 (!) 123/56 - - 69 14 100 %  01/03/19 1730 (!) 147/71 - - 76 14 100 %  01/03/19 1715 (!) 134/58 - - 71 17 100 %  01/03/19 1709 - - - 72 17 100 %  01/03/19 1705 136/66 (!) 97.5 F (36.4 C) - 72 16 100 %     Recent laboratory studies:  Recent Labs    01/03/19 1611 01/04/19 0348  WBC  --  9.0  HGB 11.2* 8.9*  HCT 33.0* 27.7*  PLT  --  188  NA 138 132*  K 4.1 4.9  CL  --  103  CO2  --  23  BUN  --  16  CREATININE  --  0.84  GLUCOSE 152* 328*  CALCIUM  --  8.1*     Discharge Medications:   Allergies as of 01/04/2019   No Known Allergies  Medication List    STOP taking these medications   traMADol 50 MG tablet Commonly known as:  ULTRAM     TAKE these medications   amLODipine 10 MG tablet Commonly known as:  NORVASC Take 10 mg by mouth daily.   aspirin EC 325 MG tablet Take 1 tablet (325 mg total) by mouth 2 (two) times daily. What changed:  Another medication with the same name was removed. Continue taking this medication, and follow the directions you see here.   CALCIUM 600+D3 PO Take 1 tablet by mouth daily.   cephALEXin 500 MG capsule Commonly known as:  KEFLEX Take 1 capsule (500 mg total) by mouth 2 (two) times daily for 10 days.   glipiZIDE 5 MG 24 hr tablet Commonly known as:  GLUCOTROL XL Take 5 mg by mouth every other day.   lisinopril 40 MG tablet Commonly known as:  PRINIVIL,ZESTRIL Take 40 mg by mouth daily.   Melatonin 3 MG Tabs Take 3 mg by mouth at bedtime as needed (sleep).   metoprolol tartrate 50 MG  tablet Commonly known as:  LOPRESSOR Take 50 mg by mouth 2 (two) times daily.   omeprazole 40 MG capsule Commonly known as:  PRILOSEC Take 40 mg by mouth daily.   oxyCODONE-acetaminophen 5-325 MG tablet Commonly known as:  PERCOCET/ROXICET Take 1 tablet by mouth every 4 (four) hours as needed for severe pain.   pioglitazone 30 MG tablet Commonly known as:  ACTOS Take 30 mg by mouth daily.   pravastatin 40 MG tablet Commonly known as:  PRAVACHOL Take 40 mg by mouth at bedtime.   tiZANidine 2 MG tablet Commonly known as:  ZANAFLEX Take 1 tablet (2 mg total) by mouth every 6 (six) hours as needed for muscle spasms.   zolpidem 5 MG tablet Commonly known as:  AMBIEN Take 5 mg by mouth at bedtime as needed for sleep.            Durable Medical Equipment  (From admission, onward)         Start     Ordered   01/03/19 1712  DME Walker rolling  Once    Question:  Patient needs a walker to treat with the following condition  Answer:  Status post right hip replacement   01/03/19 1711   01/03/19 1712  DME 3 n 1  Once     01/03/19 1711           Discharge Care Instructions  (From admission, onward)         Start     Ordered   01/04/19 0000  Weight bearing as tolerated    Question Answer Comment  Laterality right   Extremity Lower      01/04/19 1240   01/04/19 0000  Weight bearing as tolerated    Question Answer Comment  Laterality right   Extremity Lower      01/04/19 1244          Diagnostic Studies: Dg Chest 2 View  Result Date: 12/29/2018 CLINICAL DATA:  Preoperative evaluation for hip surgery. EXAM: CHEST - 2 VIEW COMPARISON:  Chest radiograph [place] 3, 2018 and CT chest September 16, 2011. FINDINGS: Cardiomediastinal silhouette is normal. No pleural effusions or focal consolidations. Subcentimeter nodular density projecting LEFT lung base only seen on frontal radiograph. Trachea projects midline and there is no pneumothorax. Soft tissue planes and included  osseous structures are non-suspicious. IMPRESSION: 1. No acute cardiopulmonary process. 2. Nodule projecting LEFT lung base, potentially  within LEFT breast. Consider non emergent CT chest with contrast. 3. These results will be called to the ordering clinician or representative by the professional radiologist assistant, and communication documented in [facility] Dashboard. Electronically Signed   By: Elon Alas M.D.   On: 12/29/2018 03:23   Dg C-arm 1-60 Min-no Report  Result Date: 01/03/2019 Fluoroscopy was utilized by the requesting physician.  No radiographic interpretation.   Dg Hip Operative Unilat W Or W/o Pelvis Right  Result Date: 01/03/2019 CLINICAL DATA:  Right total hip arthroplasty. EXAM: OPERATIVE right HIP (WITH PELVIS IF PERFORMED) 2 VIEWS TECHNIQUE: Fluoroscopic spot image(s) were submitted for interpretation post-operatively. FLUOROSCOPY TIME:  1 minutes 17 seconds. COMPARISON:  Radiographs of [place] 3, 2018. FINDINGS: Two intraoperative fluoroscopic images demonstrate the patient be status post right total hip arthroplasty. Expected postoperative changes are seen in the surrounding soft tissues. The femoral and acetabular components appear to be well situated. IMPRESSION: Status post right total hip arthroplasty. Electronically Signed   By: Marijo Conception, M.D.   On: 01/03/2019 17:05    Disposition: Discharge disposition: 01-Home or Self Care       Discharge Instructions    Call MD / Call 911   Complete by:  As directed    If you experience chest pain or shortness of breath, CALL 911 and be transported to the hospital emergency room.  If you develope a fever above 101 F, pus (white drainage) or increased drainage or redness at the wound, or calf pain, call your surgeon's office.   Call MD / Call 911   Complete by:  As directed    If you experience chest pain or shortness of breath, CALL 911 and be transported to the hospital emergency room.  If you develope a fever above  101 F, pus (white drainage) or increased drainage or redness at the wound, or calf pain, call your surgeon's office.   Diet Carb Modified   Complete by:  As directed    Diet Carb Modified   Complete by:  As directed    Increase activity slowly as tolerated   Complete by:  As directed    Increase activity slowly as tolerated   Complete by:  As directed    Weight bearing as tolerated   Complete by:  As directed    Laterality:  right   Extremity:  Lower   Weight bearing as tolerated   Complete by:  As directed    Laterality:  right   Extremity:  Lower      Follow-up Information    Dorna Leitz, MD In 2 weeks.   Specialty:  Orthopedic Surgery Contact information: [place] [address] [place] [facility] up.   Specialty:  [place] Why:  physical therapy Contact information: [address] [place] [address] [phone]            Signed: Erlene Senters 01/04/2019, 12:53 PM

## 2019-01-04 NOTE — Care Management Note (Signed)
Case Management Note  Patient Details  Name: Marie [name] MRN: [id number] Date of Birth: 1941/05/08  Subjective/Objective:   Discharge planning, spoke with patient at bedside. Prearranged with [place] home health for [facility] PT, evaluate and treat.   Action/Plan: [place] for referral. Has DME [phone]                  Expected Discharge Date:                  Expected Discharge Plan:  [place]  In-House Referral:  NA  Discharge planning Services  CM Consult  Post Acute Care Choice:  Home Health Choice offered to:  Patient  DME Arranged:  N/A DME Agency:  NA  HH Arranged:  PT [place] Agency:  [facility]  Status of Service:  Completed, signed off  If discussed at [place] of Stay Meetings, dates discussed:    Additional Comments:  [name], RN 01/04/2019, 11:25 AM

## 2019-01-04 NOTE — Progress Notes (Signed)
At 1354, the pt was provided with d/c instructions. After discussing the pt's plan of care upon d/c home, the pt reported no further questions or concerns.

## 2019-01-04 NOTE — Progress Notes (Signed)
Physical Therapy Treatment Patient Details Name: Marie [name] MRN: [id number] DOB: 31-Jul-1941 Today's Date: 01/04/2019    History of Present Illness R DA-THA conversion from hemiarthroplasty; PMH R TKA March 2019    PT Comments    Pt is progressing well with mobility and is ready to DC home from PT standpoint. She ambulated 120' with RW, no loss of balance, and demonstrates good understanding of THA HEP.   Follow Up Recommendations  Follow surgeon's recommendation for DC plan and follow-up therapies     Equipment Recommendations  None recommended by PT    Recommendations for Other Services       Precautions / Restrictions Precautions Precautions: Fall Precaution Comments: denies falls in past 1 year Restrictions Weight Bearing Restrictions: No Other Position/Activity Restrictions: WBAT    Mobility  Bed Mobility Overal bed mobility: Modified Independent             General bed mobility comments: HOB up  Transfers Overall transfer level: Needs assistance Equipment used: Rolling walker (2 wheeled) Transfers: Sit to/from Stand Sit to Stand: Supervision         General transfer comment: VCs hand placement  Ambulation/Gait Ambulation/Gait assistance: Supervision Gait Distance (Feet): 120 Feet Assistive device: Rolling walker (2 wheeled) Gait Pattern/deviations: Step-to pattern;Decreased weight shift to right;Trunk flexed Gait velocity: decr   General Gait Details: VCs sequencing and to lift head   Stairs             Wheelchair Mobility    Modified Rankin (Stroke Patients Only)       Balance Overall balance assessment: Modified Independent                                          Cognition Arousal/Alertness: Awake/alert Behavior During Therapy: WFL for tasks assessed/performed Overall Cognitive Status: Within Functional Limits for tasks assessed                                        Exercises Total  Joint Exercises Ankle Circles/Pumps: AROM;Both;10 reps;Supine Quad Sets: AROM;Both;5 reps;Supine Short Arc Quad: AROM;Right;10 reps;Supine Heel Slides: AAROM;Right;10 reps;Supine Hip ABduction/ADduction: AAROM;Right;10 reps;Standing Long Arc Quad: AROM;Right;10 reps;Seated Marching in Standing: AROM;Right;10 reps;Standing    General Comments        Pertinent Vitals/Pain Pain Assessment: 0-10 Pain Score: 4  Pain Location: R hip Pain Descriptors / Indicators: Sore Pain Intervention(s): Limited activity within patient's tolerance;Monitored during session;Premedicated before session;Ice applied    Home Living Family/patient expects to be discharged to:: Private residence Living Arrangements: Spouse/significant other Available Help at Discharge: Family;Available 24 hours/day Type of Home: House Home Access: Ramped entrance   Home Layout: One level Home Equipment: Walker - 2 wheels;Cane - single point;Bedside commode;Shower seat      Prior Function Level of Independence: Independent          PT Goals (current goals can now be found in the care plan section) Acute Rehab PT Goals Patient Stated Goal: be able to travel in camper PT Goal Formulation: With patient/family Time For Goal Achievement: 01/18/19 Potential to Achieve Goals: Good Progress towards PT goals: Progressing toward goals    Frequency    7X/week      PT Plan Current plan remains appropriate    Co-evaluation  AM-PAC PT "6 Clicks" Mobility   Outcome Measure  Help needed turning from your back to your side while in a flat bed without using bedrails?: None Help needed moving from lying on your back to sitting on the side of a flat bed without using bedrails?: None Help needed moving to and from a bed to a chair (including a wheelchair)?: None Help needed standing up from a chair using your arms (e.g., wheelchair or bedside chair)?: None Help needed to walk in hospital room?:  None Help needed climbing 3-5 steps with a railing? : A Little 6 Click Score: 23    End of Session Equipment Utilized During Treatment: Gait belt Activity Tolerance: Patient tolerated treatment well Patient left: in chair;with call bell/phone within reach;with family/visitor present Nurse Communication: Mobility status PT Visit Diagnosis: Pain;Difficulty in walking, not elsewhere classified (R26.2) Pain - Right/Left: Right Pain - part of body: Hip     Time: 1303-1330 PT Time Calculation (min) (ACUTE ONLY): 27 min  Charges:  $Gait Training: 8-22 mins $Therapeutic Exercise: 8-22 mins                    Blondell Reveal Kistler PT 01/04/2019  Acute Rehabilitation Services Pager [phone] Office [phone]

## 2019-01-04 NOTE — Evaluation (Signed)
Physical Therapy Evaluation Patient Details Name: Marie [name] MRN: [id number] DOB: 06-06-1941 Today's Date: 01/04/2019   History of Present Illness  R DA-THA revision of hemiarthroplasty; PMH R TKA March 2019  Clinical Impression  Pt is s/p THA resulting in the deficits listed below (see PT Problem List). Pt ambulated 20' with RW and performed THA HEP with min assist. Good progress expected.  Pt will benefit from skilled PT to increase their independence and safety with mobility to allow discharge to the venue listed below.      Follow Up Recommendations Follow surgeon's recommendation for DC plan and follow-up therapies    Equipment Recommendations  None recommended by PT    Recommendations for Other Services       Precautions / Restrictions Precautions Precautions: Fall Precaution Comments: denies falls in past 1 year Restrictions Weight Bearing Restrictions: No Other Position/Activity Restrictions: WBAT      Mobility  Bed Mobility Overal bed mobility: Modified Independent             General bed mobility comments: HOB up  Transfers Overall transfer level: Needs assistance Equipment used: Rolling walker (2 wheeled) Transfers: Sit to/from Stand Sit to Stand: Min guard         General transfer comment: VCs hand placement  Ambulation/Gait Ambulation/Gait assistance: Min guard Gait Distance (Feet): 20 Feet Assistive device: Rolling walker (2 wheeled) Gait Pattern/deviations: Step-to pattern;Decreased weight shift to right;Trunk flexed Gait velocity: decr   General Gait Details: VCs sequencing and to lift head  Stairs            Wheelchair Mobility    Modified Rankin (Stroke Patients Only)       Balance Overall balance assessment: Modified Independent                                           Pertinent Vitals/Pain Pain Assessment: 0-10 Pain Score: 6  Pain Location: R hip Pain Descriptors / Indicators: Sore Pain  Intervention(s): Limited activity within patient's tolerance;Monitored during session;Premedicated before session;Ice applied    Home Living Family/patient expects to be discharged to:: Private residence Living Arrangements: Spouse/significant other Available Help at Discharge: Family;Available 24 hours/day Type of Home: House Home Access: Ramped entrance     Home Layout: One level Home Equipment: Walker - 2 wheels;Cane - single point;Bedside commode;Shower seat      Prior Function Level of Independence: Independent               Hand Dominance        Extremity/Trunk Assessment   Upper Extremity Assessment Upper Extremity Assessment: Overall WFL for tasks assessed    Lower Extremity Assessment Lower Extremity Assessment: RLE deficits/detail RLE Deficits / Details: R hip 2/5, R knee extension +3/5 RLE Sensation: WNL    Cervical / Trunk Assessment Cervical / Trunk Assessment: Normal  Communication   Communication: No difficulties  Cognition Arousal/Alertness: Awake/alert Behavior During Therapy: WFL for tasks assessed/performed Overall Cognitive Status: Within Functional Limits for tasks assessed                                        General Comments      Exercises Total Joint Exercises Ankle Circles/Pumps: AROM;Both;10 reps;Supine Quad Sets: AROM;Both;5 reps;Supine Short Arc Quad: AROM;Right;10 reps;Supine Heel Slides: AAROM;Right;10 reps;Supine Hip  ABduction/ADduction: AAROM;Right;10 reps;Supine Long Arc Quad: AROM;Right;10 reps;Seated   Assessment/Plan    PT Assessment Patient needs continued PT services  PT Problem List Decreased strength;Decreased activity tolerance;Decreased mobility;Pain       PT Treatment Interventions DME instruction;Gait training;Functional mobility training;Therapeutic exercise;Patient/family education;Therapeutic activities    PT Goals (Current goals can be found in the Care Plan section)  Acute Rehab  PT Goals Patient Stated Goal: to walk PT Goal Formulation: With patient/family Time For Goal Achievement: 01/18/19 Potential to Achieve Goals: Good    Frequency 7X/week   Barriers to discharge        Co-evaluation               AM-PAC PT "6 Clicks" Mobility  Outcome Measure Help needed turning from your back to your side while in a flat bed without using bedrails?: None Help needed moving from lying on your back to sitting on the side of a flat bed without using bedrails?: A Little Help needed moving to and from a bed to a chair (including a wheelchair)?: A Little Help needed standing up from a chair using your arms (e.g., wheelchair or bedside chair)?: A Little Help needed to walk in hospital room?: A Little Help needed climbing 3-5 steps with a railing? : A Little 6 Click Score: 19    End of Session Equipment Utilized During Treatment: Gait belt Activity Tolerance: Patient tolerated treatment well Patient left: in chair;with call bell/phone within reach Nurse Communication: Mobility status PT Visit Diagnosis: Pain;Difficulty in walking, not elsewhere classified (R26.2) Pain - Right/Left: Right Pain - part of body: Hip    Time: 7106-2694 PT Time Calculation (min) (ACUTE ONLY): 25 min   Charges:   PT Evaluation $PT Eval Low Complexity: 1 Low PT Treatments $Gait Training: 8-22 mins        Blondell Reveal Kistler PT 01/04/2019  Acute Rehabilitation Services Pager [phone] Office [phone]

## 2019-01-04 NOTE — Op Note (Signed)
NAME: Marie [name], HOH MEDICAL RECORD [id number] ACCOUNT [id number] DATE OF BIRTH:02-25-1941 FACILITY: [facility] LOCATION: [facility] PHYSICIAN:Lanetta Figuero L. Lasandra Batley, MD  OPERATIVE REPORT  DATE OF PROCEDURE:  01/03/2019  PREOPERATIVE DIAGNOSIS:  Painful hemiarthroplasty, right hip.  POSTOPERATIVE DIAGNOSIS:  Painful hemiarthroplasty, right hip.  PRINCIPAL PROCEDURE: 1.  Conversion of a previously performed right hemiarthroplasty to right total hip replacement with a Gription sector cup, size 56 with a single dome screw, neutral liner, and a +1.5 metal hip ball 36 mm. 2.  Interpretation of multiple intraoperative fluoroscopic images.  SURGEON:  Dorna Leitz, MD  ASSISTANT:  Gaspar Skeeters PA-C, who was present for the entire case and assisted by manipulation, traction, and closing to minimize OR time.  BRIEF HISTORY:  The patient is a 78 year old female who had a hemiarthroplasty done through an anterior approach about 18 months ago.  She was having significant ongoing pain.  Because of continuous significant ongoing pain.  She was evaluated and felt  to need conversion to a total hip arthroplasty.  She was brought to the operating room for this procedure.  DESCRIPTION OF PROCEDURE:  The patient brought to the operating room after adequate anesthesia obtained with a spinal anesthetic.  The patient was placed supine on the table.  She was then moved onto the [place] bed.  At this point, I put a gross traction  on her and then a couple of turns of fine traction and we continued to increase this up about every 5 minutes a couple of turns of traction.  She was prepped and draped in the usual sterile fashion and following this, an incision was made for an anterior  approach to the hip.  Subcutaneous tissue down to level of the tensor fascia was identified and divided.  Finger dissection was taken through the tensor fascia and had to use a Cobb to get some of the muscle freed up and put retractors above and below   the neck; did a capsulotomy.  At this point, I could easily see the head.  Brought fluoroscopy in.  We actually had the hip almost down to station.  At that point, I was able to just take a footed bone tamp and knock the head and stem off.  We then  externally rotated the hip and were able to extract the metal ball with a Lane bone clamp.  Following that, we were able to externally rotate the leg and push it up Fran[place] and did a massive debridement of the labrum and synovium.  We had sent fluid for  Gram staining and culture.  We at this point identified the acetabulum and sequentially reamed up to a level of 51 mm.  Used fluoro.  It looked like we were closing.  I got a 52 Gription cup out and put it in.  It just did not bite in any way that I  wanted it to.  I then reamed with a 53 reamer and opened a 56 mm sector Gription cup, hammered into place at 45 degrees of lateral opening and 30 degrees of anteversion.  Got an excellent fit, but then I thought that a single screw might be good.  So,  went ahead and put in a 15 mm dome screw and got excellent fit and bite here.  Excellent stability of the acetabulum.  At this point, we brought the hip around into an externally rotated position.  I put the +0 ball on it and reduced it.  The leg length  was pretty close  at this point.  It was about as short as we could get her.  We put in a neutral liner in and at that point, we did a stability test to get her with 60 degrees of extension and 80 degrees of external rotation and she stayed in place.  At  that point, we measured her leg length.  She was a touch long, but I felt like she is also a little less offset than what she had, and I really did not have any ability to change the offset other than by lengthening and did not want to make her tunnel  along and she was stable here.  So, happily we had a nice capsule to repair and we repaired that into an anatomic position and following this, she was irrigated,  suctioned dry and we got our final fluoroscopic images.  Closed the capsule with  Vicryl  running, closed the tensor fascia with 0 Vicryl running, skin with 0 and 2-0 Vicryl and skin staples.  Sterile compressive dressing was applied and the patient was taken to recovery.  She was noted to be in satisfactory condition.    Estimated blood loss for procedure was 900 according to Anesthesia.  I did not think it was really that much blood loss, but that is recorded amount per Anesthesia.  AN/NUANCE  D:01/03/2019 T:01/04/2019 JOB:[id number]

## 2019-01-04 NOTE — Progress Notes (Signed)
Subjective: 1 Day Post-Op Procedure(s) (LRB): CONVERSION FROM HEMI TO RIGHT TOTAL HIP ARTHROPLASTY ANTERIOR APPROACH (Right) Patient reports pain as mild.  Taking by mouth and voiding okay.  Wants to go home today after physical therapy.  Objective: Vital signs in last 24 hours: Temp:  [97.5 F (36.4 C)-98.3 F (36.8 C)] 98.3 F (36.8 C) (01/28 1037) Pulse Rate:  [62-93] 92 (01/28 1037) Resp:  [13-17] 16 (01/28 1037) BP: (108-147)/(52-71) 122/53 (01/28 1037) SpO2:  [99 %-100 %] 100 % (01/28 1037)  Intake/Output from previous day: 01/27 0701 - 01/28 0700 In: 4404.6 [P.O.:300; I.V.:3154.6; IV Piggyback:950] Out: 1800 [Urine:900; Blood:900] Intake/Output this shift: Total I/O In: 818.3 [P.O.:240; I.V.:578.3] Out: 200 [Urine:200]  Recent Labs    01/03/19 1611 01/04/19 0348  HGB 11.2* 8.9*   Recent Labs    01/03/19 1611 01/04/19 0348  WBC  --  9.0  RBC  --  2.92*  HCT 33.0* 27.7*  PLT  --  188   Recent Labs    01/03/19 1611 01/04/19 0348  NA 138 132*  K 4.1 4.9  CL  --  103  CO2  --  23  BUN  --  16  CREATININE  --  0.84  GLUCOSE 152* 328*  CALCIUM  --  8.1*   No results for input(s): LABPT, INR in the last 72 hours. Right hip exam: ABD soft Intact pulses distally Dorsiflexion/Plantar flexion intact Incision: dressing C/D/I Compartment soft    Assessment/Plan: 1 Day Post-Op Procedure(s) (LRB): CONVERSION FROM HEMI TO RIGHT TOTAL HIP ARTHROPLASTY ANTERIOR APPROACH (Right)  Plan: Up with physical therapy. Aspirin 325 mg twice daily for DVT prophylaxis 1 month postop. Weight-bear as tolerated on right with walker. We will add Keflex 500 mg twice daily 1 week postop. Follow-up with Dr. Berenice Primas in 2 weeks.    Erlene Senters 01/04/2019, 12:35 PM

## 2019-01-09 LAB — AEROBIC/ANAEROBIC CULTURE (SURGICAL/DEEP WOUND)

## 2019-01-09 LAB — AEROBIC/ANAEROBIC CULTURE W GRAM STAIN (SURGICAL/DEEP WOUND): Culture: NO GROWTH

## 2019-01-18 DIAGNOSIS — Z9889 Other specified postprocedural states: Secondary | ICD-10-CM | POA: Diagnosis not present

## 2019-01-18 DIAGNOSIS — M7061 Trochanteric bursitis, right hip: Secondary | ICD-10-CM | POA: Diagnosis not present

## 2019-01-31 ENCOUNTER — Ambulatory Visit (HOSPITAL_COMMUNITY)
Admission: RE | Admit: 2019-01-31 | Discharge: 2019-01-31 | Disposition: A | Discharge status: Home / Self Care | Payer: Medicare Other | Source: Ambulatory Visit | Attending: Orthopedic Surgery | Admitting: Orthopedic Surgery

## 2019-01-31 DIAGNOSIS — I700 Atherosclerosis of aorta: Secondary | ICD-10-CM | POA: Diagnosis not present

## 2019-01-31 DIAGNOSIS — R9389 Abnormal findings on diagnostic imaging of other specified body structures: Secondary | ICD-10-CM

## 2019-01-31 MED ORDER — IOHEXOL 300 MG/ML  SOLN
75.0000 mL | Freq: Once | INTRAMUSCULAR | Status: AC | PRN
  Administered 2019-01-31: 75 mL via INTRAVENOUS

## 2019-02-08 DIAGNOSIS — M7061 Trochanteric bursitis, right hip: Secondary | ICD-10-CM | POA: Diagnosis not present

## 2019-03-02 DIAGNOSIS — M1611 Unilateral primary osteoarthritis, right hip: Secondary | ICD-10-CM | POA: Diagnosis not present

## 2019-03-02 DIAGNOSIS — E119 Type 2 diabetes mellitus without complications: Secondary | ICD-10-CM | POA: Diagnosis not present

## 2019-03-02 DIAGNOSIS — I10 Essential (primary) hypertension: Secondary | ICD-10-CM | POA: Diagnosis not present

## 2019-03-22 DIAGNOSIS — M7061 Trochanteric bursitis, right hip: Secondary | ICD-10-CM | POA: Diagnosis not present

## 2019-06-21 DIAGNOSIS — M7062 Trochanteric bursitis, left hip: Secondary | ICD-10-CM | POA: Diagnosis not present

## 2019-06-23 DIAGNOSIS — E7800 Pure hypercholesterolemia, unspecified: Secondary | ICD-10-CM | POA: Diagnosis not present

## 2019-06-23 DIAGNOSIS — Z1389 Encounter for screening for other disorder: Secondary | ICD-10-CM | POA: Diagnosis not present

## 2019-06-23 DIAGNOSIS — E119 Type 2 diabetes mellitus without complications: Secondary | ICD-10-CM | POA: Diagnosis not present

## 2019-06-23 DIAGNOSIS — M810 Age-related osteoporosis without current pathological fracture: Secondary | ICD-10-CM | POA: Diagnosis not present

## 2019-06-23 DIAGNOSIS — Z0000 Encounter for general adult medical examination without abnormal findings: Secondary | ICD-10-CM | POA: Diagnosis not present

## 2019-06-23 DIAGNOSIS — I10 Essential (primary) hypertension: Secondary | ICD-10-CM | POA: Diagnosis not present

## 2019-06-23 IMAGING — RF DG HIP (WITH PELVIS) OPERATIVE*R*
1 series · 2 of 2 positions shown · non-contrast
Comparison: Radiographs May 10, 2017.

CLINICAL DATA: Right total hip arthroplasty.

EXAM:
OPERATIVE right HIP (WITH PELVIS IF PERFORMED) 2 VIEWS
TECHNIQUE: Fluoroscopic spot image(s) were submitted for interpretation
post-operatively.
FLUOROSCOPY TIME:  1 minutes 17 seconds.

[Series 1: run · 2 of 2 slices shown]
[im 1/2]
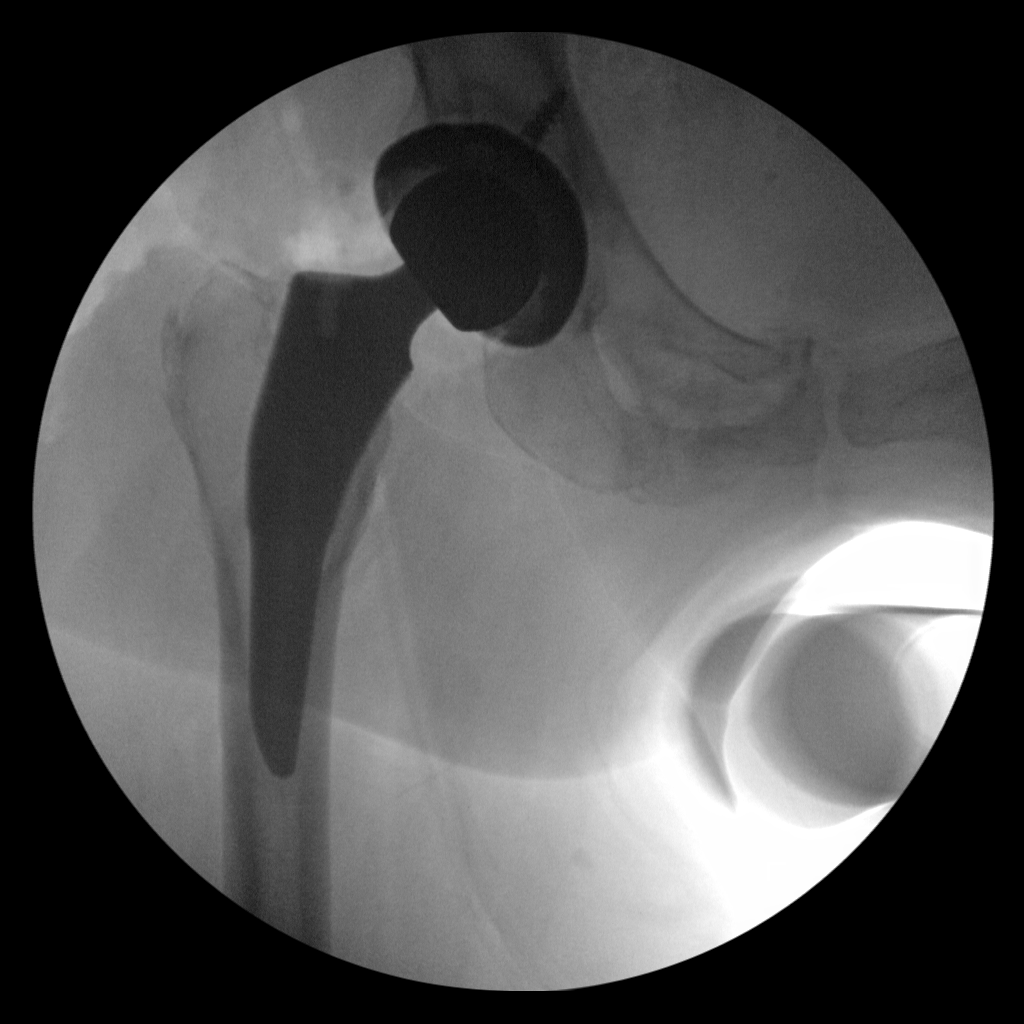
[im 2/2]
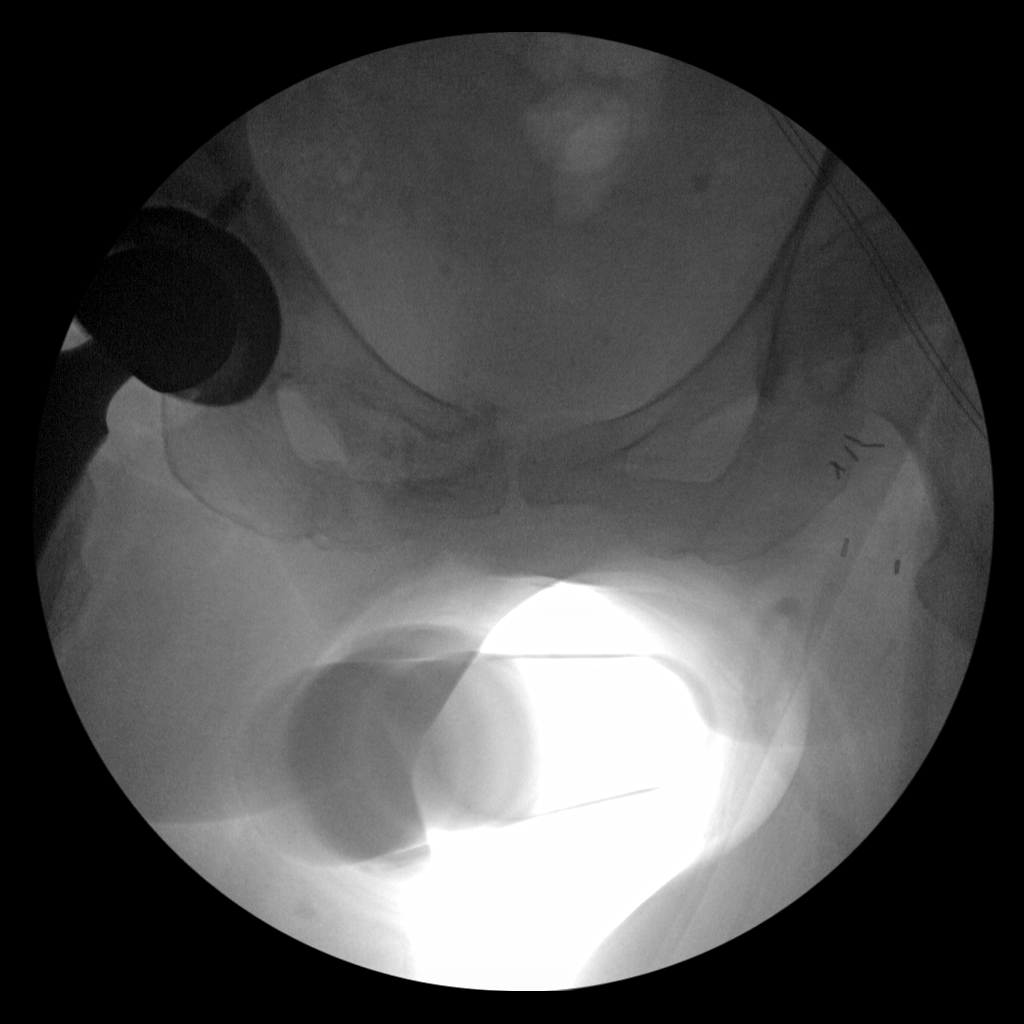

[2 of 2 positions shown; findings below may reference images not displayed]

FINDINGS: Two intraoperative fluoroscopic images demonstrate the patient be
status post right total hip arthroplasty. Expected postoperative
changes are seen in the surrounding soft tissues. The femoral and
acetabular components appear to be well situated.
IMPRESSION: Status post right total hip arthroplasty.

## 2019-06-27 DIAGNOSIS — Z1231 Encounter for screening mammogram for malignant neoplasm of breast: Secondary | ICD-10-CM | POA: Diagnosis not present

## 2019-06-29 DIAGNOSIS — D225 Melanocytic nevi of trunk: Secondary | ICD-10-CM | POA: Diagnosis not present

## 2019-06-29 DIAGNOSIS — L821 Other seborrheic keratosis: Secondary | ICD-10-CM | POA: Diagnosis not present

## 2019-06-29 DIAGNOSIS — L820 Inflamed seborrheic keratosis: Secondary | ICD-10-CM | POA: Diagnosis not present

## 2019-06-29 DIAGNOSIS — D1801 Hemangioma of skin and subcutaneous tissue: Secondary | ICD-10-CM | POA: Diagnosis not present

## 2019-07-05 DIAGNOSIS — M810 Age-related osteoporosis without current pathological fracture: Secondary | ICD-10-CM | POA: Diagnosis not present

## 2019-07-21 IMAGING — CT CT CHEST W/ CM
2 of 3 series · 15 of 36 positions shown, 18 images · IV contrast (Omni 300)
Comparison: Radiograph December 28, 2018. CT scan September 16, 2011.

CLINICAL DATA: Abnormal chest x-ray

EXAM:
CT CHEST WITH CONTRAST
TECHNIQUE: Multidetector CT imaging of the chest was performed during
intravenous contrast administration.
CONTRAST:  75mL OMNIPAQUE IOHEXOL 300 MG/ML  SOLN

[Series 3: chest with 2mm st · axial · 0.79mm/px · z∈[-317,+3]mm · 12 of 188 slices shown, 15 images]
[im 14/188  mediastinal]
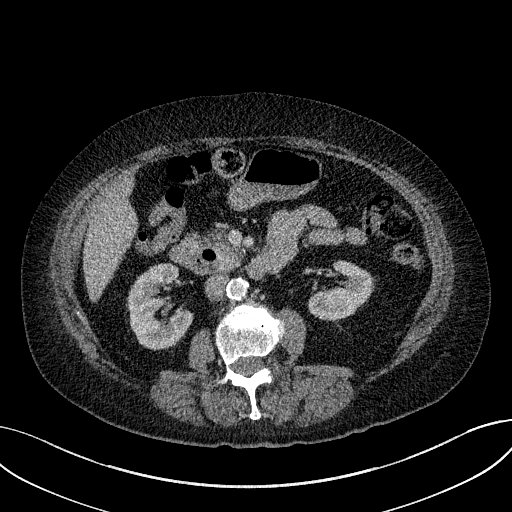
[im 14/188  lung]
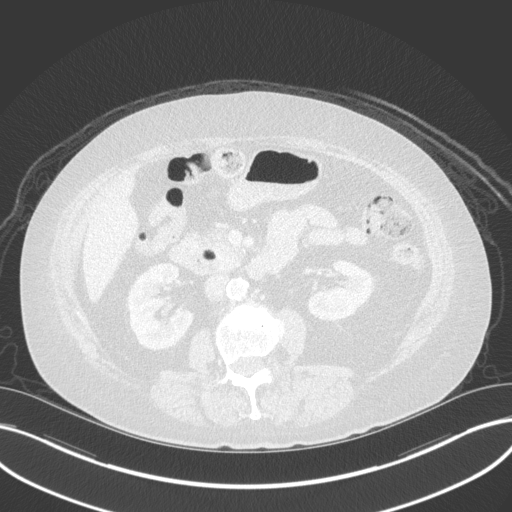
[im 28/188  lung]
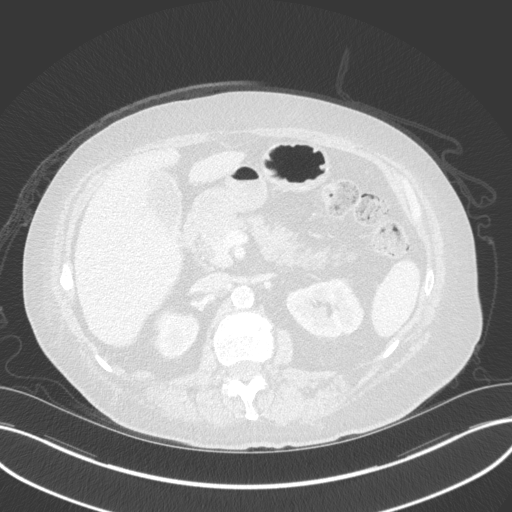
[im 42/188  lung]
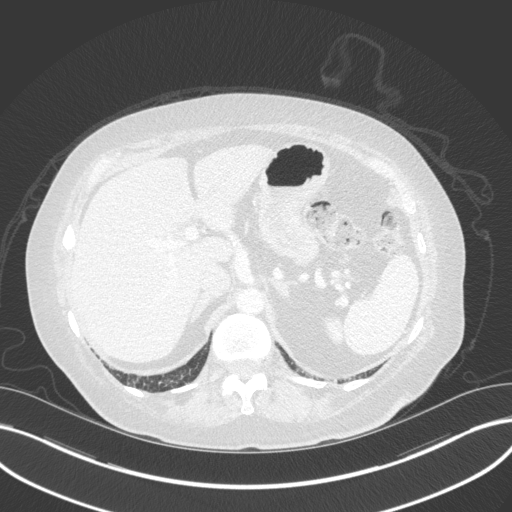
[im 56/188  lung]
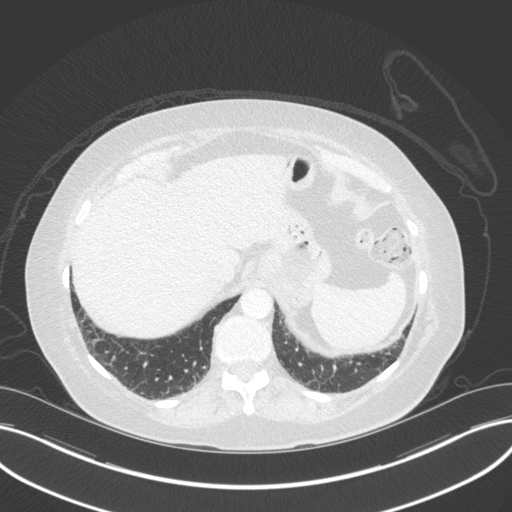
[im 70/188  mediastinal]
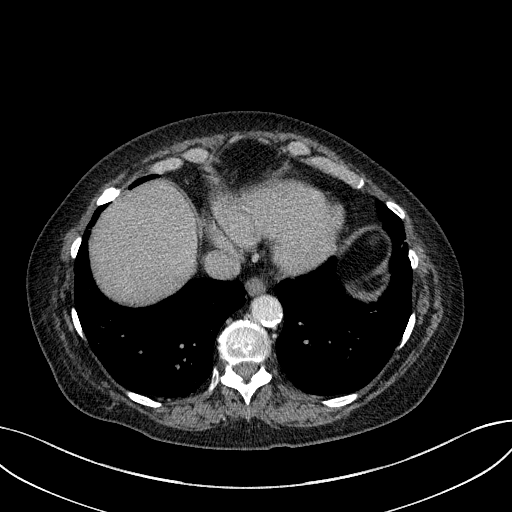
[im 70/188  lung]
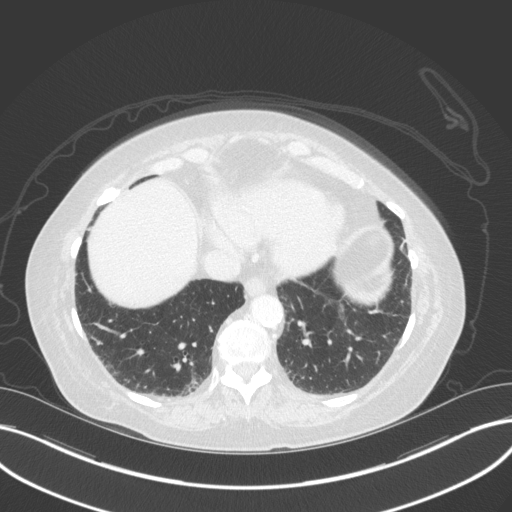
[im 84/188  lung]
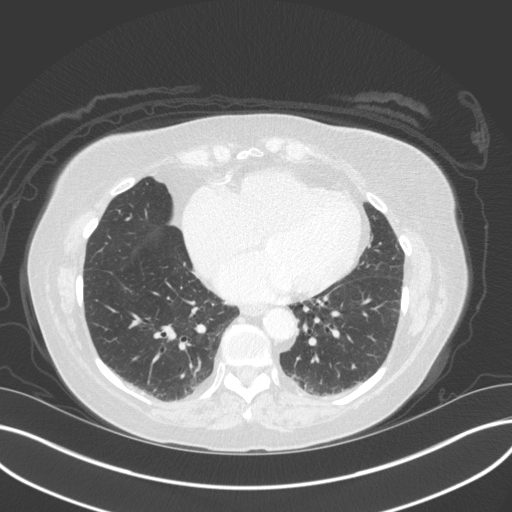
[im 104/188  lung]
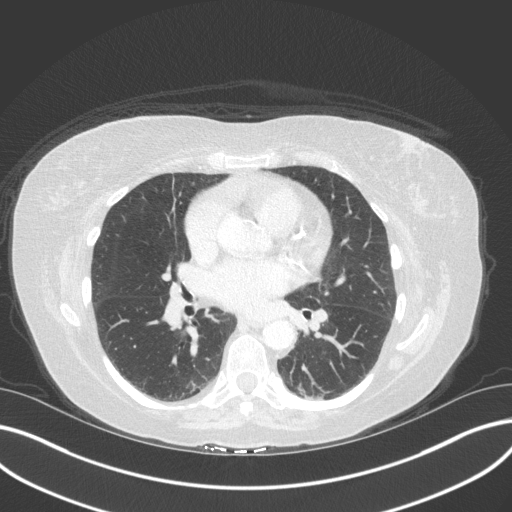
[im 118/188  lung]
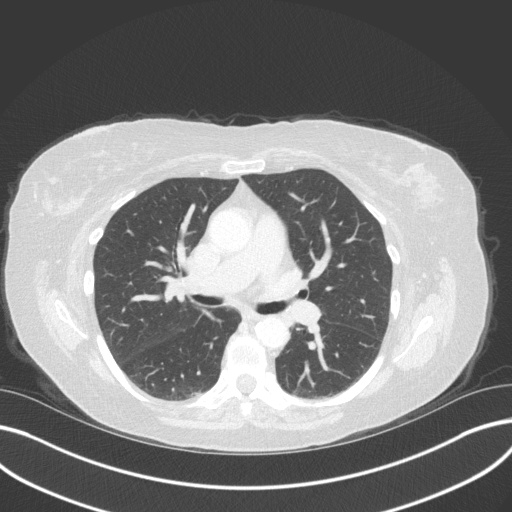
[im 132/188  mediastinal]
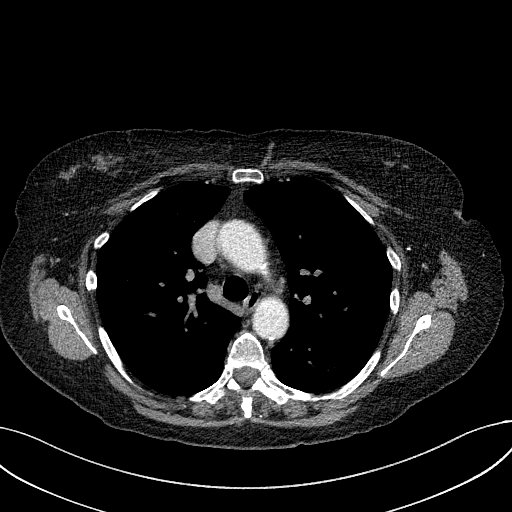
[im 132/188  lung]
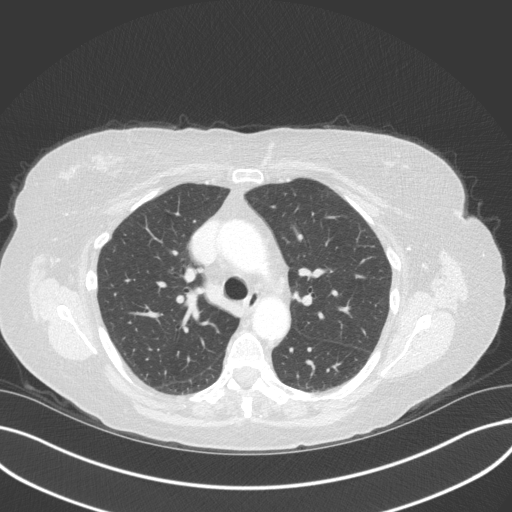
[im 146/188  lung]
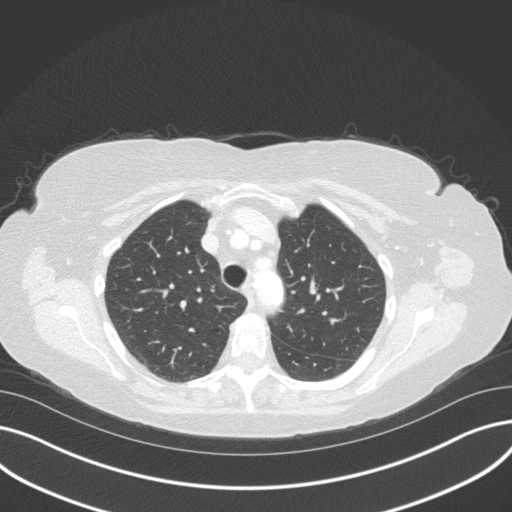
[im 160/188  lung]
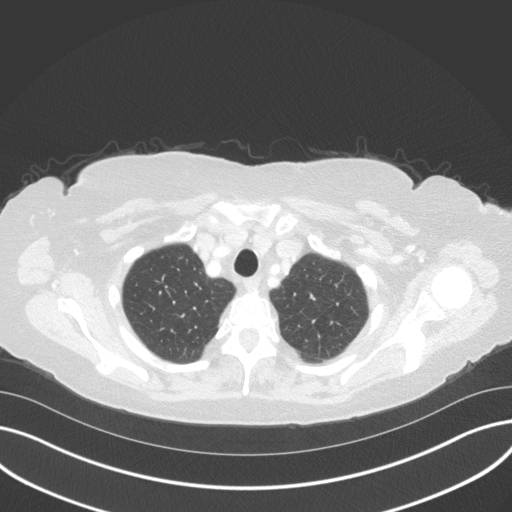
[im 174/188  lung]
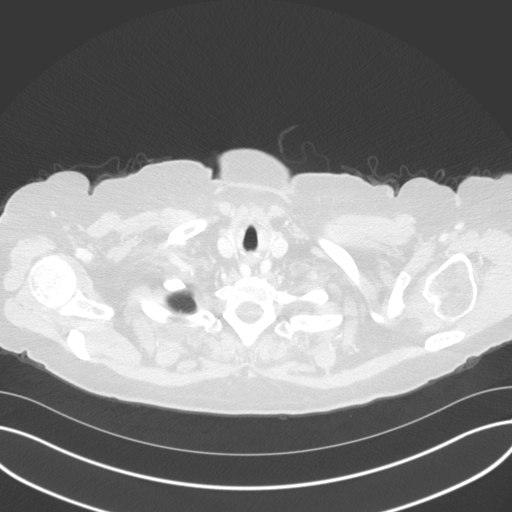

[Series 5: chest with 3mm st cor · coronal · 0.68mm/px · 3 of 101 slices shown]
[im 21/101  lung]
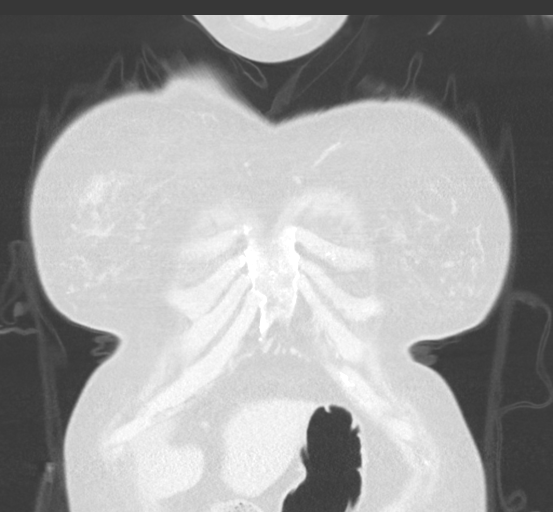
[im 41/101  lung]
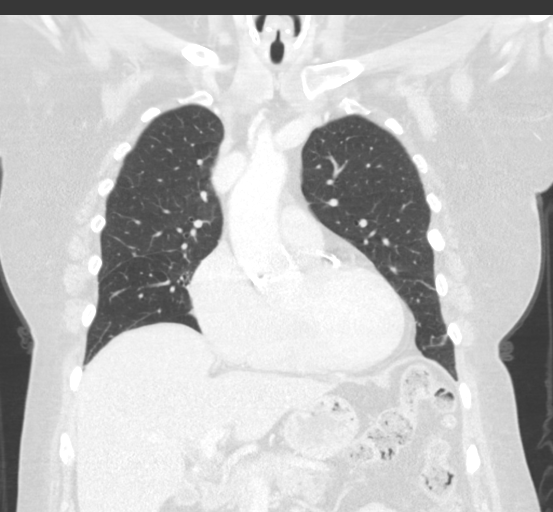
[im 61/101  lung]
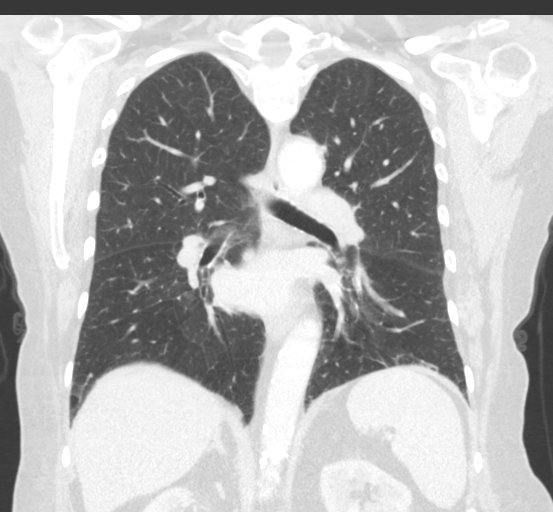

[15 of 36 positions shown; findings below may reference images not displayed]

FINDINGS: Cardiovascular: Atherosclerosis of thoracic aorta is noted without
aneurysm or dissection. Coronary artery calcifications are noted.
Normal cardiac size. No pericardial effusion is noted.

Mediastinum/Nodes: No enlarged mediastinal, hilar, or axillary lymph
nodes. Thyroid gland, trachea, and esophagus demonstrate no
significant findings.

Lungs/Pleura: No pneumothorax or pleural effusion is noted. Minimal
scarring or subsegmental atelectasis is noted in the lingular
segment of left upper lobe as well as in the right middle lobe. No
definite nodule is noted.

Upper Abdomen: No acute abnormality.

Musculoskeletal: No chest wall abnormality. No acute or significant
osseous findings.
IMPRESSION: Minimal scarring or subsegmental atelectasis is noted in lingular
segment of left upper lobe as well as in the right middle lobe. No
definite pulmonary nodule is noted.

Coronary artery calcifications are noted.

Aortic Atherosclerosis (J1HC8-[id number].K).

## 2019-08-17 DIAGNOSIS — M810 Age-related osteoporosis without current pathological fracture: Secondary | ICD-10-CM | POA: Diagnosis not present

## 2019-08-17 DIAGNOSIS — I10 Essential (primary) hypertension: Secondary | ICD-10-CM | POA: Diagnosis not present

## 2019-08-17 DIAGNOSIS — R002 Palpitations: Secondary | ICD-10-CM | POA: Diagnosis not present

## 2019-08-17 DIAGNOSIS — E119 Type 2 diabetes mellitus without complications: Secondary | ICD-10-CM | POA: Diagnosis not present

## 2019-09-05 DIAGNOSIS — K219 Gastro-esophageal reflux disease without esophagitis: Secondary | ICD-10-CM | POA: Diagnosis not present

## 2019-09-26 DIAGNOSIS — E119 Type 2 diabetes mellitus without complications: Secondary | ICD-10-CM | POA: Diagnosis not present

## 2019-09-26 DIAGNOSIS — Z23 Encounter for immunization: Secondary | ICD-10-CM | POA: Diagnosis not present

## 2019-10-06 DIAGNOSIS — R143 Flatulence: Secondary | ICD-10-CM | POA: Diagnosis not present

## 2019-10-06 DIAGNOSIS — K219 Gastro-esophageal reflux disease without esophagitis: Secondary | ICD-10-CM | POA: Diagnosis not present

## 2019-10-18 DIAGNOSIS — E119 Type 2 diabetes mellitus without complications: Secondary | ICD-10-CM | POA: Diagnosis not present

## 2019-10-18 DIAGNOSIS — H5213 Myopia, bilateral: Secondary | ICD-10-CM | POA: Diagnosis not present

## 2020-01-05 DIAGNOSIS — M25551 Pain in right hip: Secondary | ICD-10-CM | POA: Diagnosis not present

## 2020-01-05 DIAGNOSIS — Z96641 Presence of right artificial hip joint: Secondary | ICD-10-CM | POA: Diagnosis not present

## 2020-01-05 DIAGNOSIS — M545 Low back pain: Secondary | ICD-10-CM | POA: Diagnosis not present

## 2020-01-06 DIAGNOSIS — E119 Type 2 diabetes mellitus without complications: Secondary | ICD-10-CM | POA: Diagnosis not present

## 2020-01-06 DIAGNOSIS — E7800 Pure hypercholesterolemia, unspecified: Secondary | ICD-10-CM | POA: Diagnosis not present

## 2020-01-06 DIAGNOSIS — I10 Essential (primary) hypertension: Secondary | ICD-10-CM | POA: Diagnosis not present

## 2020-01-10 DIAGNOSIS — H353131 Nonexudative age-related macular degeneration, bilateral, early dry stage: Secondary | ICD-10-CM | POA: Diagnosis not present

## 2020-01-10 DIAGNOSIS — E119 Type 2 diabetes mellitus without complications: Secondary | ICD-10-CM | POA: Diagnosis not present

## 2020-01-10 DIAGNOSIS — H25811 Combined forms of age-related cataract, right eye: Secondary | ICD-10-CM | POA: Diagnosis not present

## 2020-01-10 DIAGNOSIS — Z01818 Encounter for other preprocedural examination: Secondary | ICD-10-CM | POA: Diagnosis not present

## 2020-01-17 DIAGNOSIS — H259 Unspecified age-related cataract: Secondary | ICD-10-CM | POA: Diagnosis not present

## 2020-01-17 DIAGNOSIS — H25811 Combined forms of age-related cataract, right eye: Secondary | ICD-10-CM | POA: Diagnosis not present

## 2020-01-17 DIAGNOSIS — Z79899 Other long term (current) drug therapy: Secondary | ICD-10-CM | POA: Diagnosis not present

## 2020-01-17 DIAGNOSIS — E785 Hyperlipidemia, unspecified: Secondary | ICD-10-CM | POA: Diagnosis not present

## 2020-01-17 DIAGNOSIS — K219 Gastro-esophageal reflux disease without esophagitis: Secondary | ICD-10-CM | POA: Diagnosis not present

## 2020-01-17 DIAGNOSIS — Z7984 Long term (current) use of oral hypoglycemic drugs: Secondary | ICD-10-CM | POA: Diagnosis not present

## 2020-01-17 DIAGNOSIS — E1136 Type 2 diabetes mellitus with diabetic cataract: Secondary | ICD-10-CM | POA: Diagnosis not present

## 2020-01-17 DIAGNOSIS — I10 Essential (primary) hypertension: Secondary | ICD-10-CM | POA: Diagnosis not present

## 2020-01-17 DIAGNOSIS — M1990 Unspecified osteoarthritis, unspecified site: Secondary | ICD-10-CM | POA: Diagnosis not present

## 2020-02-28 DIAGNOSIS — K219 Gastro-esophageal reflux disease without esophagitis: Secondary | ICD-10-CM | POA: Diagnosis not present

## 2020-02-28 DIAGNOSIS — H353131 Nonexudative age-related macular degeneration, bilateral, early dry stage: Secondary | ICD-10-CM | POA: Diagnosis not present

## 2020-02-28 DIAGNOSIS — M1990 Unspecified osteoarthritis, unspecified site: Secondary | ICD-10-CM | POA: Diagnosis not present

## 2020-02-28 DIAGNOSIS — E1136 Type 2 diabetes mellitus with diabetic cataract: Secondary | ICD-10-CM | POA: Diagnosis not present

## 2020-02-28 DIAGNOSIS — Z79899 Other long term (current) drug therapy: Secondary | ICD-10-CM | POA: Diagnosis not present

## 2020-02-28 DIAGNOSIS — H259 Unspecified age-related cataract: Secondary | ICD-10-CM | POA: Diagnosis not present

## 2020-02-28 DIAGNOSIS — Z7982 Long term (current) use of aspirin: Secondary | ICD-10-CM | POA: Diagnosis not present

## 2020-02-28 DIAGNOSIS — E785 Hyperlipidemia, unspecified: Secondary | ICD-10-CM | POA: Diagnosis not present

## 2020-02-28 DIAGNOSIS — Z961 Presence of intraocular lens: Secondary | ICD-10-CM | POA: Diagnosis not present

## 2020-02-28 DIAGNOSIS — Z7984 Long term (current) use of oral hypoglycemic drugs: Secondary | ICD-10-CM | POA: Diagnosis not present

## 2020-02-28 DIAGNOSIS — H52203 Unspecified astigmatism, bilateral: Secondary | ICD-10-CM | POA: Diagnosis not present

## 2020-02-28 DIAGNOSIS — H25812 Combined forms of age-related cataract, left eye: Secondary | ICD-10-CM | POA: Diagnosis not present

## 2020-02-28 DIAGNOSIS — I10 Essential (primary) hypertension: Secondary | ICD-10-CM | POA: Diagnosis not present

## 2020-02-28 DIAGNOSIS — M810 Age-related osteoporosis without current pathological fracture: Secondary | ICD-10-CM | POA: Diagnosis not present

## 2020-03-12 DIAGNOSIS — I4891 Unspecified atrial fibrillation: Secondary | ICD-10-CM | POA: Diagnosis not present

## 2020-03-13 DIAGNOSIS — I4891 Unspecified atrial fibrillation: Secondary | ICD-10-CM | POA: Diagnosis not present

## 2020-03-21 ENCOUNTER — Encounter: Payer: Self-pay | Admitting: Cardiology

## 2020-03-21 ENCOUNTER — Ambulatory Visit: Payer: Medicare Other | Admitting: Cardiology

## 2020-03-21 ENCOUNTER — Other Ambulatory Visit: Payer: Self-pay

## 2020-03-21 VITALS — BP 110/64 | HR 68 | Ht 66.0 in | Wt 218.0 lb

## 2020-03-21 DIAGNOSIS — R0683 Snoring: Secondary | ICD-10-CM

## 2020-03-21 DIAGNOSIS — E782 Mixed hyperlipidemia: Secondary | ICD-10-CM | POA: Diagnosis not present

## 2020-03-21 DIAGNOSIS — I480 Paroxysmal atrial fibrillation: Secondary | ICD-10-CM

## 2020-03-21 DIAGNOSIS — E118 Type 2 diabetes mellitus with unspecified complications: Secondary | ICD-10-CM

## 2020-03-21 DIAGNOSIS — E669 Obesity, unspecified: Secondary | ICD-10-CM

## 2020-03-21 DIAGNOSIS — I10 Essential (primary) hypertension: Secondary | ICD-10-CM

## 2020-03-21 HISTORY — DX: Type 2 diabetes mellitus with unspecified complications: E11.8

## 2020-03-21 HISTORY — DX: Snoring: R06.83

## 2020-03-21 HISTORY — DX: Paroxysmal atrial fibrillation: I48.0
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
## 2020-03-21 NOTE — Patient Instructions (Addendum)
Medication Instructions:  Your physician recommends that you continue on your current medications as directed. Please refer to the Current Medication list given to you today.  *If you need a refill on your cardiac medications before your next appointment, please call your pharmacy*  Testing/Procedures: Your physician has recommended that you have a sleep study. This test records several body functions during sleep, including: brain activity, eye movement, oxygen and carbon dioxide blood levels, heart rate and rhythm, breathing rate and rhythm, the flow of air through your mouth and nose, snoring, body muscle movements, and chest and belly movement.  Follow-Up: At [facility], you and your health needs are our priority.  As part of our continuing mission to provide you with exceptional heart care, we have created designated Provider Care Teams.  These Care Teams include your primary Cardiologist (physician) and Advanced Practice Providers (APPs -  Physician Assistants and Nurse Practitioners) who all work together to provide you with the care you need, when you need it.  We recommend signing up for the patient portal called "MyChart".  Sign up information is provided on this After Visit Summary.  MyChart is used to connect with patients for Virtual Visits (Telemedicine).  Patients are able to view lab/test results, encounter notes, upcoming appointments, etc.  Non-urgent messages can be sent to your provider as well.   To learn more about what you can do with MyChart, go to [email or web address].    Your next appointment:   3 month(s)  The format for your next appointment:   In Person  Provider:   You will see Berniece Salines, DO.  Or, you can be scheduled with the following Advanced Practice Provider on your designated Care Team (at our [facility]):  Laurann Montana, FNP

## 2020-03-22 DIAGNOSIS — I4891 Unspecified atrial fibrillation: Secondary | ICD-10-CM | POA: Diagnosis not present

## 2020-03-23 ENCOUNTER — Telehealth: Payer: Self-pay

## 2020-03-23 MED ORDER — FLECAINIDE ACETATE 50 MG PO TABS: 50.0000 mg | ORAL_TABLET | Freq: Two times a day (BID) | ORAL | 3 refills | Status: DC

## 2020-03-23 NOTE — Telephone Encounter (Signed)
-----   Message from Berniece Salines, DO sent at 03/23/2020  8:31 AM EDT ----- Please let the patient know I was able to review her echocardiogram that she got at [facility] yesterday 03/22/2020.Thankfully her left ventricle ejection fraction is normal 55 to 60%.  She does have diastolic dysfunction which means that her heart does not relax completely now there is nothing to do for this.  There is trace to mild leakage of the mitral valve.  This leakage of the tricuspid valve.  Overall this is a very good result.  I would like to start her on flecainide 50 mg twice daily.  If she is agreeable to start this medication please send the prescription to the pharmacy and then asked the patient to see me in a week.  Due to the new medication start.

## 2020-03-23 NOTE — Telephone Encounter (Signed)
Spoke with pt and reviewed echo results with her per Dr. Harriet Masson. She agrees to begin flecainide 50mg  bid. Follow up appt set for next week. She had no additional questions.

## 2020-03-28 ENCOUNTER — Ambulatory Visit: Payer: Medicare Other | Admitting: Cardiology

## 2020-03-28 ENCOUNTER — Other Ambulatory Visit: Payer: Self-pay

## 2020-03-28 ENCOUNTER — Encounter: Payer: Self-pay | Admitting: Cardiology

## 2020-03-28 VITALS — BP 100/58 | HR 68 | Ht 72.0 in | Wt 220.0 lb

## 2020-03-28 DIAGNOSIS — I480 Paroxysmal atrial fibrillation: Secondary | ICD-10-CM | POA: Diagnosis not present

## 2020-03-28 DIAGNOSIS — E1100 Type 2 diabetes mellitus with hyperosmolarity without nonketotic hyperglycemic-hyperosmolar coma (NKHHC): Secondary | ICD-10-CM

## 2020-03-28 DIAGNOSIS — E782 Mixed hyperlipidemia: Secondary | ICD-10-CM

## 2020-03-28 DIAGNOSIS — I10 Essential (primary) hypertension: Secondary | ICD-10-CM | POA: Diagnosis not present

## 2020-03-28 NOTE — Patient Instructions (Signed)
Medication Instructions:  No medication changes *If you need a refill on your cardiac medications before your next appointment, please call your pharmacy*   Lab Work: None ordered If you have labs (blood work) drawn today and your tests are completely normal, you will receive your results only by: Marland Kitchen MyChart Message (if you have MyChart) OR . A paper copy in the mail If you have any lab test that is abnormal or we need to change your treatment, we will call you to review the results.   Testing/Procedures: None ordered   Follow-Up: At [facility], you and your health needs are our priority.  As part of our continuing mission to provide you with exceptional heart care, we have created designated Provider Care Teams.  These Care Teams include your primary Cardiologist (physician) and Advanced Practice Providers (APPs -  Physician Assistants and Nurse Practitioners) who all work together to provide you with the care you need, when you need it.  We recommend signing up for the patient portal called "MyChart".  Sign up information is provided on this After Visit Summary.  MyChart is used to connect with patients for Virtual Visits (Telemedicine).  Patients are able to view lab/test results, encounter notes, upcoming appointments, etc.  Non-urgent messages can be sent to your provider as well.   To learn more about what you can do with MyChart, go to [email or web address].    Your next appointment:   Keep appointment as scheduled with Dr. Harriet Masson  Other Instructions NA

## 2020-03-28 NOTE — Progress Notes (Signed)
Cardiology Office Note:    Date:  03/28/2020   ID:  Marie [name], DOB 1940-12-20, MRN [id number]  PCP:  Townsend Roger, MD  Cardiologist:  Berniece Salines, DO  Electrophysiologist:  None   Referring MD: Townsend Roger, MD   Chief Complaint  Patient presents with  . Follow-up    Flecainide    History of Present Illness:    Marie [name] is a 79 y.o. female with a hx of hypertension, hyperlipidemia, diabetes paroxysmal atrial fibrillation patient was started on Xarelto by her PCP as well as metoprolol. I did see the patient on March 21, 2020 and continued her anticoagulation as well as her Lopressor.  We discussed rhythm strategy and she was able to start antiarrhythmic.  She did get a echocardiogram at [facility] on March 22, 2020 which showed left ventricle ejection fraction is normal 55 to 60%.  She does have diastolic dysfunction which means that her heart does not relax completely now there is nothing to do for this.  There is trace to mild leakage of the mitral valve.  This leakage of the tricuspid valve.  Overall this is a very good result.  With this I started the patient on flecainide 50 mg twice daily.  She is here today for follow-up visit.  She offers no complaints.   Past Medical History:  Diagnosis Date  . Arthritis    knees, hands, hips  . Atrial fibrillation ([place])   . Diabetes mellitus without complication ([place])   . GERD (gastroesophageal reflux disease)   . Hyperlipidemia   . Hypertension   . Osteoporosis   . Papilloma of breast    left    Past Surgical History:  Procedure Laterality Date  . ABDOMINAL HYSTERECTOMY    . APPENDECTOMY    . BREAST DUCTAL SYSTEM EXCISION Right 05/01/2016   Procedure: RIGHT CENTRAL DUCT EXCISION;  Surgeon: Erroll Luna, MD;  Location: [place];  Service: General;  Laterality: Right;  . BREAST LUMPECTOMY WITH RADIOACTIVE SEED LOCALIZATION Left 05/01/2016   Procedure: LEFT BREAST LUMPECTOMY WITH RADIOACTIVE  SEED LOCALIZATION;  Surgeon: Erroll Luna, MD;  Location: [place];  Service: General;  Laterality: Left;  . BREAST SURGERY     left breast biopsy x2  . COLONOSCOPY    . TOTAL HIP ARTHROPLASTY Right 01/03/2019   Procedure: CONVERSION FROM HEMI TO RIGHT TOTAL HIP ARTHROPLASTY ANTERIOR APPROACH;  Surgeon: Dorna Leitz, MD;  Location: [facility];  Service: Orthopedics;  Laterality: Right;  . TOTAL KNEE ARTHROPLASTY Right 02/22/2018   Procedure: TOTAL KNEE ARTHROPLASTY;  Surgeon: Frederik Pear, MD;  Location: [place];  Service: Orthopedics;  Laterality: Right;  Marland Kitchen VARICOSE VEIN SURGERY Left     Current Medications: Current Meds  Medication Sig  . acetaminophen (TYLENOL) 500 MG tablet Take 500 mg by mouth as needed.  Marland Kitchen amLODipine (NORVASC) 10 MG tablet Take 10 mg by mouth daily.   Marland Kitchen atorvastatin (LIPITOR) 20 MG tablet Take 20 mg by mouth at bedtime.  . Calcium Carb-Cholecalciferol (CALCIUM 600+D3 PO) Take 1 tablet by mouth daily.  . flecainide (TAMBOCOR) 50 MG tablet Take 1 tablet (50 mg total) by mouth 2 (two) times daily.  Marland Kitchen glipiZIDE (GLUCOTROL XL) 5 MG 24 hr tablet Take 5 mg by mouth every other day.   . hydrOXYzine (VISTARIL) 25 MG capsule 25 mg every 12 (twelve) hours as needed.  Marland Kitchen lisinopril (PRINIVIL,ZESTRIL) 40 MG tablet Take 40 mg by mouth daily.  . Melatonin  3 MG TABS Take 3 mg by mouth at bedtime as needed (sleep).  . metoprolol (LOPRESSOR) 50 MG tablet Take 50 mg by mouth 2 (two) times daily.  Marland Kitchen omeprazole (PRILOSEC) 40 MG capsule Take 40 mg by mouth daily.  . pioglitazone (ACTOS) 30 MG tablet Take 30 mg by mouth daily.  . rivaroxaban (XARELTO) 20 MG TABS tablet Take 20 mg by mouth daily with supper.     Allergies:   Patient has no known allergies.   Social History   Socioeconomic History  . Marital status: Married    Spouse name: Not on file  . Number of children: Not on file  . Years of education: Not on file  . Highest education level: Not on file    Occupational History  . Not on file  Tobacco Use  . Smoking status: Never Smoker  . Smokeless tobacco: Never Used  Substance and Sexual Activity  . Alcohol use: No  . Drug use: No  . Sexual activity: Yes    Birth control/protection: Post-menopausal  Other Topics Concern  . Not on file  Social History Narrative  . Not on file   Social Determinants of Health   Financial Resource Strain:   . Difficulty of Paying Living Expenses:   Food Insecurity:   . Worried About Charity fundraiser in the Last Year:   . Arboriculturist in the Last Year:   Transportation Needs:   . Film/video editor (Medical):   Marland Kitchen Lack of Transportation (Non-Medical):   Physical Activity:   . Days of Exercise per Week:   . Minutes of Exercise per Session:   Stress:   . Feeling of Stress :   Social Connections:   . Frequency of Communication with Friends and Family:   . Frequency of Social Gatherings with Friends and Family:   . Attends Religious Services:   . Active Member of Clubs or Organizations:   . Attends Archivist Meetings:   Marland Kitchen Marital Status:      Family History: The patient's family history includes Bleeding Disorder in her sister; Colon cancer in her mother and sister; Heart attack in her father; High blood pressure in her brother, brother, and mother; Pancreatic cancer in her sister.  ROS:   Review of Systems  Constitution: Negative for decreased appetite, fever and weight gain.  HENT: Negative for congestion, ear discharge, hoarse voice and sore throat.   Eyes: Negative for discharge, redness, vision loss in right eye and visual halos.  Cardiovascular: Negative for chest pain, dyspnea on exertion, leg swelling, orthopnea and palpitations.  Respiratory: Negative for cough, hemoptysis, shortness of breath and snoring.   Endocrine: Negative for heat intolerance and polyphagia.  Hematologic/Lymphatic: Negative for bleeding problem. Does not bruise/bleed easily.  Skin:  Negative for flushing, nail changes, rash and suspicious lesions.  Musculoskeletal: Negative for arthritis, joint pain, muscle cramps, myalgias, neck pain and stiffness.  Gastrointestinal: Negative for abdominal pain, bowel incontinence, diarrhea and excessive appetite.  Genitourinary: Negative for decreased libido, genital sores and incomplete emptying.  Neurological: Negative for brief paralysis, focal weakness, headaches and loss of balance.  Psychiatric/Behavioral: Negative for altered mental status, depression and suicidal ideas.  Allergic/Immunologic: Negative for HIV exposure and persistent infections.    EKGs/Labs/Other Studies Reviewed:    The following studies were reviewed today:   EKG:  The ekg ordered today demonstrates sinus rhythm, heart rate 60/min with first-degree AV block and nonspecific interventricular conduction defect, compared to EKG done  on 03/21/2020 first-degree AV block is new.  Recent Labs: No results found for requested labs within last 8760 hours.  Recent Lipid Panel No results found for: CHOL, TRIG, HDL, CHOLHDL, VLDL, LDLCALC, LDLDIRECT  Physical Exam:    VS:  BP (!) 100/58 (BP Location: Left Arm, Patient Position: Sitting, Cuff Size: Large)   Pulse 68   Ht 6' (1.829 m)   Wt 220 lb (99.8 kg)   SpO2 97%   BMI 29.84 kg/m     Wt Readings from Last 3 Encounters:  03/28/20 220 lb (99.8 kg)  03/21/20 218 lb (98.9 kg)  01/03/19 212 lb (96.2 kg)     GEN: Well nourished, well developed in no acute distress HEENT: Normal NECK: No JVD; No carotid bruits LYMPHATICS: No lymphadenopathy CARDIAC: S1S2 noted,RRR, no murmurs, rubs, gallops RESPIRATORY:  Clear to auscultation without rales, wheezing or rhonchi  ABDOMEN: Soft, non-tender, non-distended, +bowel sounds, no guarding. EXTREMITIES: No edema, No cyanosis, no clubbing MUSCULOSKELETAL:  No deformity  SKIN: Warm and dry NEUROLOGIC:  Alert and oriented x 3, non-focal PSYCHIATRIC:  Normal affect,  good insight  ASSESSMENT:    1. PAF (paroxysmal atrial fibrillation) ([place])   2. Essential hypertension   3. Type 2 diabetes mellitus with hyperosmolarity without coma, without long-term current use of insulin ([place])   4. Mixed hyperlipidemia    PLAN:     1.  She is in sinus rhythm today.  She recently was started on flecainide 50 mg twice daily I am not going to change this medication she will continue it for now.  She was also advised to stay on her metoprolol.  We will continue her Xarelto 20 mg daily.  2.  Hypertension her blood pressure is appropriate in the office today.  No changes will be made.  3.  Hyperlipidemia we will continue patient on atorvastatin 20 mg daily.  4.  Diabetes type 2-continue patient on her current diabetes regimen per her PCP.  The patient is in agreement with the above plan. The patient left the office in stable condition.  The patient will follow up in 3 months or sooner if needed.   Medication Adjustments/Labs and Tests Ordered: Current medicines are reviewed at length with the patient today.  Concerns regarding medicines are outlined above.  Orders Placed This Encounter  Procedures  . EKG 12-Lead   No orders of the defined types were placed in this encounter.   Patient Instructions  Medication Instructions:  No medication changes *If you need a refill on your cardiac medications before your next appointment, please call your pharmacy*   Lab Work: None ordered If you have labs (blood work) drawn today and your tests are completely normal, you will receive your results only by: Marland Kitchen MyChart Message (if you have MyChart) OR . A paper copy in the mail If you have any lab test that is abnormal or we need to change your treatment, we will call you to review the results.   Testing/Procedures: None ordered   Follow-Up: At [facility], you and your health needs are our priority.  As part of our continuing mission to provide you with  exceptional heart care, we have created designated Provider Care Teams.  These Care Teams include your primary Cardiologist (physician) and Advanced Practice Providers (APPs -  Physician Assistants and Nurse Practitioners) who all work together to provide you with the care you need, when you need it.  We recommend signing up for the patient portal called "MyChart".  Sign up information is provided on this After Visit Summary.  MyChart is used to connect with patients for Virtual Visits (Telemedicine).  Patients are able to view lab/test results, encounter notes, upcoming appointments, etc.  Non-urgent messages can be sent to your provider as well.   To learn more about what you can do with MyChart, go to [email or web address].    Your next appointment:   Keep appointment as scheduled with Dr. Harriet Masson  Other Instructions NA     Adopting a Healthy Lifestyle.  Know what a healthy weight is for you (roughly BMI <25) and aim to maintain this   Aim for 7+ servings of fruits and vegetables daily   65-80+ fluid ounces of water or unsweet tea for healthy kidneys   Limit to max 1 drink of alcohol per day; avoid smoking/tobacco   Limit animal fats in diet for cholesterol and heart health - choose grass fed whenever available   Avoid highly processed foods, and foods high in saturated/trans fats   Aim for low stress - take time to unwind and care for your mental health   Aim for 150 min of moderate intensity exercise weekly for heart health, and weights twice weekly for bone health   Aim for 7-9 hours of sleep daily   When it comes to diets, agreement about the perfect plan isnt easy to find, even among the experts. Experts at the [place] developed an idea known as the Healthy Eating Plate. Just imagine a plate divided into logical, healthy portions.   The emphasis is on diet quality:   Load up on vegetables and fruits - one-half of your plate: Aim for color and  variety, and remember that potatoes dont count.   Go for whole grains - one-quarter of your plate: Whole wheat, barley, wheat berries, quinoa, oats, brown rice, and foods made with them. If you want pasta, go with whole wheat pasta.   Protein power - one-quarter of your plate: Fish, chicken, beans, and nuts are all healthy, versatile protein sources. Limit red meat.   The diet, however, does go beyond the plate, offering a few other suggestions.   Use healthy plant oils, such as olive, canola, soy, corn, sunflower and peanut. Check the labels, and avoid partially hydrogenated oil, which have unhealthy trans fats.   If youre thirsty, drink water. Coffee and tea are good in moderation, but skip sugary drinks and limit milk and dairy products to one or two daily servings.   The type of carbohydrate in the diet is more important than the amount. Some sources of carbohydrates, such as vegetables, fruits, whole grains, and beans-are healthier than others.   Finally, stay active  Signed, Berniece Salines, DO  03/28/2020 11:08 AM    [place]

## 2020-04-06 DIAGNOSIS — I10 Essential (primary) hypertension: Secondary | ICD-10-CM | POA: Diagnosis not present

## 2020-04-06 DIAGNOSIS — E119 Type 2 diabetes mellitus without complications: Secondary | ICD-10-CM | POA: Diagnosis not present

## 2020-04-06 DIAGNOSIS — I4891 Unspecified atrial fibrillation: Secondary | ICD-10-CM | POA: Diagnosis not present

## 2020-04-06 DIAGNOSIS — M545 Low back pain: Secondary | ICD-10-CM | POA: Diagnosis not present

## 2020-04-06 DIAGNOSIS — E7800 Pure hypercholesterolemia, unspecified: Secondary | ICD-10-CM | POA: Diagnosis not present

## 2020-04-10 ENCOUNTER — Telehealth: Payer: Self-pay

## 2020-04-10 DIAGNOSIS — I10 Essential (primary) hypertension: Secondary | ICD-10-CM

## 2020-04-10 DIAGNOSIS — R0683 Snoring: Secondary | ICD-10-CM

## 2020-04-10 NOTE — Telephone Encounter (Signed)
-----   Message from Antonieta Iba, RN sent at 03/21/2020 11:45 AM EDT ----- Sleep study has been ordered. Thanks!

## 2020-04-10 NOTE — Telephone Encounter (Signed)
Per [facility] precert is not required for Sleep Study Decision ID [id number].  Forwarded to Gershon Cull to schedule.

## 2020-04-11 DIAGNOSIS — M5137 Other intervertebral disc degeneration, lumbosacral region: Secondary | ICD-10-CM | POA: Diagnosis not present

## 2020-04-11 DIAGNOSIS — M545 Low back pain: Secondary | ICD-10-CM | POA: Diagnosis not present

## 2020-04-11 DIAGNOSIS — M40299 Other kyphosis, site unspecified: Secondary | ICD-10-CM | POA: Diagnosis not present

## 2020-04-13 NOTE — Telephone Encounter (Signed)
Patient is scheduled for lab study on 04/26/20. pt is scheduled for COVID screening on 04/24/20 1 pm prior to SS.Marland Kitchen  Patient understands her sleep study will be done at [facility] sleep lab. Patient understands she will receive a sleep packet in a week or so. Patient understands to call if she does not receive the sleep packet in a timely manner.  Left detailed message on voicemail with date and time of titration and informed patient to call back to confirm or reschedule.

## 2020-04-16 NOTE — Addendum Note (Signed)
Addended by: Freada Bergeron on: 04/16/2020 11:51 AM   Modules accepted: Orders

## 2020-04-16 NOTE — Telephone Encounter (Signed)
Patient called to cancel her in lab sleep study, she wants a home sleep test. Patient will have her ordering provider to order a home sleep test.

## 2020-04-24 ENCOUNTER — Other Ambulatory Visit (HOSPITAL_COMMUNITY): Payer: Medicare Other

## 2020-04-25 DIAGNOSIS — M5137 Other intervertebral disc degeneration, lumbosacral region: Secondary | ICD-10-CM | POA: Diagnosis not present

## 2020-04-25 DIAGNOSIS — M545 Low back pain: Secondary | ICD-10-CM | POA: Diagnosis not present

## 2020-04-26 ENCOUNTER — Encounter (HOSPITAL_BASED_OUTPATIENT_CLINIC_OR_DEPARTMENT_OTHER): Payer: Medicare Other | Admitting: Cardiovascular Disease

## 2020-04-27 DIAGNOSIS — R198 Other specified symptoms and signs involving the digestive system and abdomen: Secondary | ICD-10-CM | POA: Diagnosis not present

## 2020-04-27 DIAGNOSIS — R1310 Dysphagia, unspecified: Secondary | ICD-10-CM | POA: Diagnosis not present

## 2020-04-27 DIAGNOSIS — K219 Gastro-esophageal reflux disease without esophagitis: Secondary | ICD-10-CM | POA: Diagnosis not present

## 2020-04-27 DIAGNOSIS — Z86010 Personal history of colonic polyps: Secondary | ICD-10-CM | POA: Diagnosis not present

## 2020-05-29 DIAGNOSIS — M545 Low back pain: Secondary | ICD-10-CM | POA: Diagnosis not present

## 2020-06-19 DIAGNOSIS — E119 Type 2 diabetes mellitus without complications: Secondary | ICD-10-CM | POA: Diagnosis not present

## 2020-06-19 DIAGNOSIS — M5137 Other intervertebral disc degeneration, lumbosacral region: Secondary | ICD-10-CM | POA: Diagnosis not present

## 2020-06-19 DIAGNOSIS — Z7982 Long term (current) use of aspirin: Secondary | ICD-10-CM | POA: Diagnosis not present

## 2020-06-19 DIAGNOSIS — M5136 Other intervertebral disc degeneration, lumbar region: Secondary | ICD-10-CM | POA: Diagnosis not present

## 2020-06-19 DIAGNOSIS — M545 Low back pain: Secondary | ICD-10-CM | POA: Diagnosis not present

## 2020-06-19 DIAGNOSIS — Z79899 Other long term (current) drug therapy: Secondary | ICD-10-CM | POA: Diagnosis not present

## 2020-06-19 DIAGNOSIS — Z7901 Long term (current) use of anticoagulants: Secondary | ICD-10-CM | POA: Diagnosis not present

## 2020-06-20 ENCOUNTER — Ambulatory Visit: Payer: Medicare Other | Admitting: Cardiology

## 2020-07-11 DIAGNOSIS — K219 Gastro-esophageal reflux disease without esophagitis: Secondary | ICD-10-CM | POA: Diagnosis not present

## 2020-07-11 DIAGNOSIS — E7800 Pure hypercholesterolemia, unspecified: Secondary | ICD-10-CM | POA: Diagnosis not present

## 2020-07-11 DIAGNOSIS — I4891 Unspecified atrial fibrillation: Secondary | ICD-10-CM | POA: Diagnosis not present

## 2020-07-11 DIAGNOSIS — Z0000 Encounter for general adult medical examination without abnormal findings: Secondary | ICD-10-CM | POA: Diagnosis not present

## 2020-07-11 DIAGNOSIS — I10 Essential (primary) hypertension: Secondary | ICD-10-CM | POA: Diagnosis not present

## 2020-07-11 DIAGNOSIS — E119 Type 2 diabetes mellitus without complications: Secondary | ICD-10-CM | POA: Diagnosis not present

## 2020-07-11 DIAGNOSIS — Z1389 Encounter for screening for other disorder: Secondary | ICD-10-CM | POA: Diagnosis not present

## 2020-08-01 DIAGNOSIS — M810 Age-related osteoporosis without current pathological fracture: Secondary | ICD-10-CM | POA: Diagnosis not present

## 2020-08-01 DIAGNOSIS — M8589 Other specified disorders of bone density and structure, multiple sites: Secondary | ICD-10-CM | POA: Diagnosis not present

## 2020-08-01 DIAGNOSIS — Z1231 Encounter for screening mammogram for malignant neoplasm of breast: Secondary | ICD-10-CM | POA: Diagnosis not present

## 2020-08-01 DIAGNOSIS — M85852 Other specified disorders of bone density and structure, left thigh: Secondary | ICD-10-CM | POA: Diagnosis not present

## 2020-08-02 DIAGNOSIS — M5136 Other intervertebral disc degeneration, lumbar region: Secondary | ICD-10-CM | POA: Diagnosis not present

## 2020-08-20 DIAGNOSIS — M47817 Spondylosis without myelopathy or radiculopathy, lumbosacral region: Secondary | ICD-10-CM | POA: Diagnosis not present

## 2020-08-20 DIAGNOSIS — M5136 Other intervertebral disc degeneration, lumbar region: Secondary | ICD-10-CM | POA: Diagnosis not present

## 2020-09-21 DIAGNOSIS — M5136 Other intervertebral disc degeneration, lumbar region: Secondary | ICD-10-CM | POA: Diagnosis not present

## 2020-09-21 DIAGNOSIS — M5416 Radiculopathy, lumbar region: Secondary | ICD-10-CM | POA: Diagnosis not present

## 2020-09-24 ENCOUNTER — Other Ambulatory Visit: Payer: Self-pay

## 2020-09-24 DIAGNOSIS — K219 Gastro-esophageal reflux disease without esophagitis: Secondary | ICD-10-CM | POA: Insufficient documentation

## 2020-09-24 DIAGNOSIS — I4891 Unspecified atrial fibrillation: Secondary | ICD-10-CM | POA: Insufficient documentation

## 2020-09-24 DIAGNOSIS — E119 Type 2 diabetes mellitus without complications: Secondary | ICD-10-CM | POA: Insufficient documentation

## 2020-09-24 DIAGNOSIS — Z7409 Other reduced mobility: Secondary | ICD-10-CM | POA: Insufficient documentation

## 2020-09-24 DIAGNOSIS — E785 Hyperlipidemia, unspecified: Secondary | ICD-10-CM | POA: Insufficient documentation

## 2020-09-24 DIAGNOSIS — D249 Benign neoplasm of unspecified breast: Secondary | ICD-10-CM | POA: Insufficient documentation

## 2020-09-24 DIAGNOSIS — M810 Age-related osteoporosis without current pathological fracture: Secondary | ICD-10-CM | POA: Insufficient documentation

## 2020-09-24 DIAGNOSIS — M1990 Unspecified osteoarthritis, unspecified site: Secondary | ICD-10-CM | POA: Insufficient documentation

## 2020-09-24 DIAGNOSIS — I10 Essential (primary) hypertension: Secondary | ICD-10-CM | POA: Insufficient documentation

## 2020-09-24 HISTORY — DX: Other reduced mobility: Z74.09

## 2020-09-25 ENCOUNTER — Encounter: Payer: Self-pay | Admitting: Cardiology

## 2020-09-25 ENCOUNTER — Ambulatory Visit: Payer: Medicare Other | Admitting: Cardiology

## 2020-09-25 ENCOUNTER — Other Ambulatory Visit: Payer: Self-pay

## 2020-09-25 VITALS — BP 127/75 | HR 65 | Ht 66.5 in | Wt 221.2 lb

## 2020-09-25 DIAGNOSIS — Z79899 Other long term (current) drug therapy: Secondary | ICD-10-CM | POA: Diagnosis not present

## 2020-09-25 DIAGNOSIS — Z5181 Encounter for therapeutic drug level monitoring: Secondary | ICD-10-CM

## 2020-09-25 DIAGNOSIS — R5383 Other fatigue: Secondary | ICD-10-CM | POA: Diagnosis not present

## 2020-09-25 DIAGNOSIS — I480 Paroxysmal atrial fibrillation: Secondary | ICD-10-CM | POA: Diagnosis not present

## 2020-09-25 DIAGNOSIS — E118 Type 2 diabetes mellitus with unspecified complications: Secondary | ICD-10-CM

## 2020-09-25 DIAGNOSIS — R0602 Shortness of breath: Secondary | ICD-10-CM

## 2020-09-25 DIAGNOSIS — G232 Striatonigral degeneration: Secondary | ICD-10-CM | POA: Diagnosis not present

## 2020-09-25 DIAGNOSIS — R0600 Dyspnea, unspecified: Secondary | ICD-10-CM

## 2020-09-25 DIAGNOSIS — I10 Essential (primary) hypertension: Secondary | ICD-10-CM

## 2020-09-25 DIAGNOSIS — R0609 Other forms of dyspnea: Secondary | ICD-10-CM

## 2020-09-25 NOTE — Patient Instructions (Addendum)
Medication Instructions:  No medication changes. *If you need a refill on your cardiac medications before your next appointment, please call your pharmacy*   Lab Work: Your physician recommends that you have labs done in the office today. Your test included  basic metabolic panel, magnesium, vitamin D, flecainide and complete blood count..  If you have labs (blood work) drawn today and your tests are completely normal, you will receive your results only by: Marland Kitchen MyChart Message (if you have MyChart) OR . A paper copy in the mail If you have any lab test that is abnormal or we need to change your treatment, we will call you to review the results.   Testing/Procedures: Your physician has requested that you have a lexiscan myoview. For further information please visit [email or web address]. Please follow instruction sheet, as given.  The test will take approximately 3 to 4 hours to complete; you may bring reading material.  If someone comes with you to your appointment, they will need to remain in the main lobby due to limited space in the testing area.   How to prepare for your Myocardial Perfusion Test: . Do not eat or drink 3 hours prior to your test, except you may have water. . Do not consume products containing caffeine (regular or decaffeinated) 12 hours prior to your test. (ex: coffee, chocolate, sodas, tea). . Do bring a list of your current medications with you.  If not listed below, you may take your medications as normal. . Do wear comfortable clothes (no dresses or overalls) and walking shoes, tennis shoes preferred (No heels or open toe shoes are allowed). . Do NOT wear cologne, perfume, aftershave, or lotions (deodorant is allowed). . If these instructions are not followed, your test will have to be rescheduled.    Follow-Up: At [facility], you and your health needs are our priority.  As part of our continuing mission to provide you with exceptional heart care, we have  created designated Provider Care Teams.  These Care Teams include your primary Cardiologist (physician) and Advanced Practice Providers (APPs -  Physician Assistants and Nurse Practitioners) who all work together to provide you with the care you need, when you need it.  We recommend signing up for the patient portal called "MyChart".  Sign up information is provided on this After Visit Summary.  MyChart is used to connect with patients for Virtual Visits (Telemedicine).  Patients are able to view lab/test results, encounter notes, upcoming appointments, etc.  Non-urgent messages can be sent to your provider as well.   To learn more about what you can do with MyChart, go to [email or web address].    Your next appointment:   3 month(s)  The format for your next appointment:   In Person  Provider:   Berniece Salines, DO   Other Instructions  Cardiac Nuclear Scan A cardiac nuclear scan is a test that is done to check the flow of blood to your heart. It is done when you are resting and when you are exercising. The test looks for problems such as:  Not enough blood reaching a portion of the heart.  The heart muscle not working as it should. You may need this test if:  You have heart disease.  You have had lab results that are not normal.  You have had heart surgery or a balloon procedure to open up blocked arteries (angioplasty).  You have chest pain.  You have shortness of breath. In this test, a special dye (tracer) is put  into your bloodstream. The tracer will travel to your heart. A camera will then take pictures of your heart to see how the tracer moves through your heart. This test is usually done at a hospital and takes 2-4 hours. Tell a doctor about:  Any allergies you have.  All medicines you are taking, including vitamins, herbs, eye drops, creams, and over-the-counter medicines.  Any problems you or family members have had with anesthetic medicines.  Any blood disorders  you have.  Any surgeries you have had.  Any medical conditions you have.  Whether you are pregnant or may be pregnant. What are the risks? Generally, this is a safe test. However, problems may occur, such as:  Serious chest pain and heart attack. This is only a risk if the stress portion of the test is done.  Rapid heartbeat.  A feeling of warmth in your chest. This feeling usually does not last long.  Allergic reaction to the tracer. What happens before the test?  Ask your doctor about changing or stopping your normal medicines. This is important.  Follow instructions from your doctor about what you cannot eat or drink.  Remove your jewelry on the day of the test. What happens during the test?  An IV tube will be inserted into one of your veins.  Your doctor will give you a small amount of tracer through the IV tube.  You will wait for 20-40 minutes while the tracer moves through your bloodstream.  Your heart will be monitored with an electrocardiogram (ECG).  You will lie down on an exam table.  Pictures of your heart will be taken for about 15-20 minutes.  You may also have a stress test. For this test, one of these things may be done: ? You will be asked to exercise on a treadmill or a stationary bike. ? You will be given medicines that will make your heart work harder. This is done if you are unable to exercise.  When blood flow to your heart has peaked, a tracer will again be given through the IV tube.  After 20-40 minutes, you will get back on the exam table. More pictures will be taken of your heart.  Depending on the tracer that is used, more pictures may need to be taken 3-4 hours later.  Your IV tube will be removed when the test is over. The test may vary among doctors and hospitals. What happens after the test?  Ask your doctor: ? Whether you can return to your normal schedule, including diet, activities, and medicines. ? Whether you should drink  more fluids. This will help to remove the tracer from your body. Drink enough fluid to keep your pee (urine) pale yellow.  Ask your doctor, or the department that is doing the test: ? When will my results be ready? ? How will I get my results? Summary  A cardiac nuclear scan is a test that is done to check the flow of blood to your heart.  Tell your doctor whether you are pregnant or may be pregnant.  Before the test, ask your doctor about changing or stopping your normal medicines. This is important.  Ask your doctor whether you can return to your normal activities. You may be asked to drink more fluids. This information is not intended to replace advice given to you by your health care provider. Make sure you discuss any questions you have with your health care provider. Document Revised: 03/16/2019 Document Reviewed: 05/10/2018 Elsevier Patient Education  [place].
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
## 2020-09-26 ENCOUNTER — Telehealth: Payer: Self-pay | Admitting: *Deleted

## 2020-09-26 NOTE — Telephone Encounter (Signed)
Left message on voicemail per DPR in reference to upcoming appointment scheduled on 10/03/2020 at 0800 with detailed instructions given per Myocardial Perfusion Study Information Sheet for the test. LM to arrive 15 minutes early, and that it is imperative to arrive on time for appointment to keep from having the test rescheduled. If you need to cancel or reschedule your appointment, please call the office within 24 hours of your appointment. Failure to do so may result in a cancellation of your appointment, and a $50 no show fee. Phone number given for call back for any questions.  No mychart available. Derrik Mceachern, [place]anae [place]

## 2020-09-27 LAB — CBC WITH DIFFERENTIAL/PLATELET
Basophils Absolute: 0.1 10*3/uL (ref 0.0–0.2)
Basos: 1 %
EOS (ABSOLUTE): 0.2 10*3/uL (ref 0.0–0.4)
Eos: 4 %
Hematocrit: 36.8 % (ref 34.0–46.6)
Hemoglobin: 12.4 g/dL (ref 11.1–15.9)
Immature Grans (Abs): 0 10*3/uL (ref 0.0–0.1)
Immature Granulocytes: 0 %
Lymphocytes Absolute: 2 10*3/uL (ref 0.7–3.1)
Lymphs: 35 %
MCH: 30.8 pg (ref 26.6–33.0)
MCHC: 33.7 g/dL (ref 31.5–35.7)
MCV: 91 fL (ref 79–97)
Monocytes Absolute: 0.5 10*3/uL (ref 0.1–0.9)
Monocytes: 9 %
Neutrophils Absolute: 2.9 10*3/uL (ref 1.4–7.0)
Neutrophils: 51 %
Platelets: 299 10*3/uL (ref 150–450)
RBC: 4.03 x10E6/uL (ref 3.77–5.28)
RDW: 13.2 % (ref 11.7–15.4)
WBC: 5.6 10*3/uL (ref 3.4–10.8)

## 2020-09-27 LAB — BASIC METABOLIC PANEL
BUN/Creatinine Ratio: 24 (ref 12–28)
BUN: 28 mg/dL — ABNORMAL HIGH (ref 8–27)
CO2: 24 mmol/L (ref 20–29)
Calcium: 9.7 mg/dL (ref 8.7–10.3)
Chloride: 100 mmol/L (ref 96–106)
Creatinine, Ser: 1.19 mg/dL — ABNORMAL HIGH (ref 0.57–1.00)
GFR calc Af Amer: 51 mL/min/{1.73_m2} — ABNORMAL LOW (ref >59)
GFR calc non Af Amer: 44 mL/min/{1.73_m2} — ABNORMAL LOW (ref >59)
Glucose: 116 mg/dL — ABNORMAL HIGH (ref 65–99)
Potassium: 4.7 mmol/L (ref 3.5–5.2)
Sodium: 137 mmol/L (ref 134–144)

## 2020-09-27 LAB — MAGNESIUM: Magnesium: 2 mg/dL (ref 1.6–2.3)

## 2020-09-27 LAB — VITAMIN D 25 HYDROXY (VIT D DEFICIENCY, FRACTURES): Vit D, 25-Hydroxy: 31.3 ng/mL (ref 30.0–100.0)

## 2020-09-27 LAB — FLECAINIDE LEVEL: Flecainide: 0.35 ug/mL (ref 0.20–1.00)

## 2020-09-28 ENCOUNTER — Telehealth: Payer: Self-pay

## 2020-09-28 NOTE — Telephone Encounter (Signed)
Spoke with patient regarding results and recommendation.  Patient verbalizes understanding and is agreeable to plan of care. Advised patient to call back with any issues or concerns.  

## 2020-09-28 NOTE — Telephone Encounter (Signed)
-----   Message from Berniece Salines, DO sent at 09/27/2020 11:37 AM EDT -----  Flecainide level is normal.  Creatinine slightly elevated will continue to monitor

## 2020-10-03 ENCOUNTER — Other Ambulatory Visit: Payer: Self-pay

## 2020-10-03 ENCOUNTER — Ambulatory Visit (INDEPENDENT_AMBULATORY_CARE_PROVIDER_SITE_OTHER): Payer: Medicare Other

## 2020-10-03 DIAGNOSIS — R0600 Dyspnea, unspecified: Secondary | ICD-10-CM

## 2020-10-03 DIAGNOSIS — R0602 Shortness of breath: Secondary | ICD-10-CM | POA: Diagnosis not present

## 2020-10-03 DIAGNOSIS — I480 Paroxysmal atrial fibrillation: Secondary | ICD-10-CM | POA: Diagnosis not present

## 2020-10-03 DIAGNOSIS — R0609 Other forms of dyspnea: Secondary | ICD-10-CM

## 2020-10-03 LAB — MYOCARDIAL PERFUSION IMAGING
LV dias vol: 85 mL (ref 46–106)
LV sys vol: 25 mL
Peak HR: 70 {beats}/min
Rest HR: 53 {beats}/min
SDS: 1
SRS: 6
SSS: 7
TID: 0.94

## 2020-10-03 MED ORDER — TECHNETIUM TC 99M TETROFOSMIN IV KIT
10.6000 | PACK | Freq: Once | INTRAVENOUS | Status: AC | PRN
  Administered 2020-10-03: 10.6 via INTRAVENOUS

## 2020-10-03 MED ORDER — TECHNETIUM TC 99M TETROFOSMIN IV KIT
31.5000 | PACK | Freq: Once | INTRAVENOUS | Status: AC | PRN
  Administered 2020-10-03: 31.5 via INTRAVENOUS

## 2020-10-03 MED ORDER — REGADENOSON 0.4 MG/5ML IV SOLN
0.4000 mg | Freq: Once | INTRAVENOUS | Status: AC
  Administered 2020-10-03: 0.4 mg via INTRAVENOUS

## 2020-10-04 ENCOUNTER — Telehealth: Payer: Self-pay

## 2020-10-04 NOTE — Telephone Encounter (Signed)
-----   Message from Berniece Salines, DO sent at 10/03/2020 11:02 PM EDT ----- Doristine Devoid news, stress test nornal.

## 2020-10-04 NOTE — Telephone Encounter (Signed)
Spoke with patient regarding results and recommendation.  Patient verbalizes understanding and is agreeable to plan of care. Advised patient to call back with any issues or concerns.  

## 2020-10-12 DIAGNOSIS — R21 Rash and other nonspecific skin eruption: Secondary | ICD-10-CM | POA: Diagnosis not present

## 2020-10-12 DIAGNOSIS — G894 Chronic pain syndrome: Secondary | ICD-10-CM | POA: Diagnosis not present

## 2020-10-12 DIAGNOSIS — Z5181 Encounter for therapeutic drug level monitoring: Secondary | ICD-10-CM | POA: Diagnosis not present

## 2020-10-12 DIAGNOSIS — E7800 Pure hypercholesterolemia, unspecified: Secondary | ICD-10-CM | POA: Diagnosis not present

## 2020-10-12 DIAGNOSIS — Z23 Encounter for immunization: Secondary | ICD-10-CM | POA: Diagnosis not present

## 2020-10-12 DIAGNOSIS — E1165 Type 2 diabetes mellitus with hyperglycemia: Secondary | ICD-10-CM | POA: Diagnosis not present

## 2021-01-01 ENCOUNTER — Ambulatory Visit: Payer: Medicare Other | Admitting: Cardiology

## 2021-01-14 DIAGNOSIS — E1165 Type 2 diabetes mellitus with hyperglycemia: Secondary | ICD-10-CM | POA: Diagnosis not present

## 2021-01-14 DIAGNOSIS — I10 Essential (primary) hypertension: Secondary | ICD-10-CM | POA: Diagnosis not present

## 2021-01-14 DIAGNOSIS — G894 Chronic pain syndrome: Secondary | ICD-10-CM | POA: Diagnosis not present

## 2021-01-16 ENCOUNTER — Other Ambulatory Visit: Payer: Self-pay

## 2021-01-16 ENCOUNTER — Ambulatory Visit: Payer: Medicare Other | Admitting: Cardiology

## 2021-01-16 ENCOUNTER — Encounter: Payer: Self-pay | Admitting: Cardiology

## 2021-01-16 VITALS — BP 140/76 | HR 72 | Ht 66.0 in | Wt 228.4 lb

## 2021-01-16 DIAGNOSIS — E782 Mixed hyperlipidemia: Secondary | ICD-10-CM | POA: Diagnosis not present

## 2021-01-16 DIAGNOSIS — I480 Paroxysmal atrial fibrillation: Secondary | ICD-10-CM

## 2021-01-16 DIAGNOSIS — I10 Essential (primary) hypertension: Secondary | ICD-10-CM | POA: Diagnosis not present

## 2021-01-16 DIAGNOSIS — R600 Localized edema: Secondary | ICD-10-CM

## 2021-01-16 MED ORDER — FUROSEMIDE 40 MG PO TABS: ORAL_TABLET | ORAL | 3 refills | Status: DC

## 2021-01-16 MED ORDER — POTASSIUM CHLORIDE CRYS ER 20 MEQ PO TBCR: EXTENDED_RELEASE_TABLET | ORAL | 3 refills | Status: DC
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
## 2021-01-16 NOTE — Patient Instructions (Signed)
Medication Instructions:  Your physician has recommended you make the following change in your medication:  START: Lasix 40 mg (Take on Tuesday, Thursday and Saturday).  START: Potassium 20 meq (Take Tuesday, Thursday, and Saturday).  *If you need a refill on your cardiac medications before your next appointment, please call your pharmacy*   Lab Work: Your physician recommends that you return for lab work: [place], [place]  If you have labs (blood work) drawn today and your tests are completely normal, you will receive your results only by: Marland Kitchen MyChart Message (if you have MyChart) OR . A paper copy in the mail If you have any lab test that is abnormal or we need to change your treatment, we will call you to review the results.   Testing/Procedures: None   Follow-Up: At [facility], you and your health needs are our priority.  As part of our continuing mission to provide you with exceptional heart care, we have created designated Provider Care Teams.  These Care Teams include your primary Cardiologist (physician) and Advanced Practice Providers (APPs -  Physician Assistants and Nurse Practitioners) who all work together to provide you with the care you need, when you need it.  We recommend signing up for the patient portal called "MyChart".  Sign up information is provided on this After Visit Summary.  MyChart is used to connect with patients for Virtual Visits (Telemedicine).  Patients are able to view lab/test results, encounter notes, upcoming appointments, etc.  Non-urgent messages can be sent to your provider as well.   To learn more about what you can do with MyChart, go to [email or web address].    Your next appointment:   1 month(s)  The format for your next appointment:   In Person  Provider:   Berniece Salines, DO   Other Instructions

## 2021-01-17 LAB — BASIC METABOLIC PANEL
BUN/Creatinine Ratio: 15 (ref 12–28)
BUN: 18 mg/dL (ref 8–27)
CO2: 26 mmol/L (ref 20–29)
Calcium: 10.3 mg/dL (ref 8.7–10.3)
Chloride: 97 mmol/L (ref 96–106)
Creatinine, Ser: 1.22 mg/dL — ABNORMAL HIGH (ref 0.57–1.00)
GFR calc Af Amer: 49 mL/min/{1.73_m2} — ABNORMAL LOW (ref >59)
GFR calc non Af Amer: 43 mL/min/{1.73_m2} — ABNORMAL LOW (ref >59)
Glucose: 154 mg/dL — ABNORMAL HIGH (ref 65–99)
Potassium: 4.7 mmol/L (ref 3.5–5.2)
Sodium: 138 mmol/L (ref 134–144)

## 2021-01-17 LAB — MAGNESIUM: Magnesium: 1.8 mg/dL (ref 1.6–2.3)

## 2021-02-13 DIAGNOSIS — Z860100 Personal history of colon polyps, unspecified: Secondary | ICD-10-CM

## 2021-02-13 DIAGNOSIS — R070 Pain in throat: Secondary | ICD-10-CM | POA: Insufficient documentation

## 2021-02-13 DIAGNOSIS — R143 Flatulence: Secondary | ICD-10-CM

## 2021-02-13 DIAGNOSIS — R198 Other specified symptoms and signs involving the digestive system and abdomen: Secondary | ICD-10-CM

## 2021-02-13 HISTORY — DX: Pain in throat: R07.0

## 2021-02-13 HISTORY — DX: Flatulence: R14.3

## 2021-02-13 HISTORY — DX: Personal history of colonic polyps: Z86.010

## 2021-02-13 HISTORY — DX: Personal history of colon polyps, unspecified: Z86.0100

## 2021-02-13 HISTORY — DX: Other specified symptoms and signs involving the digestive system and abdomen: R19.8

## 2021-02-14 ENCOUNTER — Encounter: Payer: Self-pay | Admitting: Cardiology

## 2021-02-14 ENCOUNTER — Ambulatory Visit: Payer: Medicare Other | Admitting: Cardiology

## 2021-02-14 ENCOUNTER — Other Ambulatory Visit: Payer: Self-pay

## 2021-02-14 VITALS — BP 140/70 | HR 72 | Ht 66.0 in | Wt 227.0 lb

## 2021-02-14 DIAGNOSIS — R600 Localized edema: Secondary | ICD-10-CM

## 2021-02-14 DIAGNOSIS — I10 Essential (primary) hypertension: Secondary | ICD-10-CM | POA: Diagnosis not present

## 2021-02-14 DIAGNOSIS — I480 Paroxysmal atrial fibrillation: Secondary | ICD-10-CM

## 2021-02-14 DIAGNOSIS — E669 Obesity, unspecified: Secondary | ICD-10-CM | POA: Insufficient documentation

## 2021-02-14 HISTORY — DX: Obesity, unspecified: E66.9

## 2021-02-14 HISTORY — DX: Localized edema: R60.0

## 2021-02-14 MED ORDER — HYDROCHLOROTHIAZIDE 12.5 MG PO CAPS: 12.5000 mg | ORAL_CAPSULE | Freq: Every day | ORAL | 3 refills | Status: DC
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
## 2021-02-14 NOTE — Patient Instructions (Signed)
Medication Instructions:  Your physician has recommended you make the following change in your medication:  STOP: Lasix STOP:  Potassium STOP: Lisinopril   START: Hydrochlorothiazide 12.5 mg mg daily  *If you need a refill on your cardiac medications before your next appointment, please call your pharmacy*   Lab Work: None If you have labs (blood work) drawn today and your tests are completely normal, you will receive your results only by: Marland Kitchen MyChart Message (if you have MyChart) OR . A paper copy in the mail If you have any lab test that is abnormal or we need to change your treatment, we will call you to review the results.   Testing/Procedures: None   Follow-Up: At [facility], you and your health needs are our priority.  As part of our continuing mission to provide you with exceptional heart care, we have created designated Provider Care Teams.  These Care Teams include your primary Cardiologist (physician) and Advanced Practice Providers (APPs -  Physician Assistants and Nurse Practitioners) who all work together to provide you with the care you need, when you need it.  We recommend signing up for the patient portal called "MyChart".  Sign up information is provided on this After Visit Summary.  MyChart is used to connect with patients for Virtual Visits (Telemedicine).  Patients are able to view lab/test results, encounter notes, upcoming appointments, etc.  Non-urgent messages can be sent to your provider as well.   To learn more about what you can do with MyChart, go to [email or web address].    Your next appointment:   3 month(s)  The format for your next appointment:   In Person  Provider:   Berniece Salines, DO   Other Instructions

## 2021-02-21 ENCOUNTER — Telehealth: Payer: Self-pay | Admitting: Cardiology

## 2021-02-21 NOTE — Telephone Encounter (Signed)
That would be fine, is the lisinopril being prescribed by her PCP or that she may need to prescribe that as well.

## 2021-02-21 NOTE — Telephone Encounter (Signed)
Patient notified and will continue filling with PCP

## 2021-02-21 NOTE — Telephone Encounter (Signed)
Pt c/o medication issue:  1. Name of Medication: hydrochlorothiazide (MICROZIDE) 12.5 MG capsule  2. How are you currently taking this medication (dosage and times per day)? As prescribe  3. Are you having a reaction (difficulty breathing--STAT)? Yes, making weak all over  4. What is your medication issue? Patient is considering stop current medication to start taking lisinopril instead. Please advise

## 2021-02-26 DIAGNOSIS — Z1152 Encounter for screening for COVID-19: Secondary | ICD-10-CM | POA: Diagnosis not present

## 2021-03-28 DIAGNOSIS — G894 Chronic pain syndrome: Secondary | ICD-10-CM | POA: Diagnosis not present

## 2021-03-28 DIAGNOSIS — R609 Edema, unspecified: Secondary | ICD-10-CM | POA: Diagnosis not present

## 2021-04-06 DIAGNOSIS — I10 Essential (primary) hypertension: Secondary | ICD-10-CM | POA: Diagnosis not present

## 2021-04-06 DIAGNOSIS — E119 Type 2 diabetes mellitus without complications: Secondary | ICD-10-CM | POA: Diagnosis not present

## 2021-04-19 DIAGNOSIS — E119 Type 2 diabetes mellitus without complications: Secondary | ICD-10-CM | POA: Diagnosis not present

## 2021-04-19 DIAGNOSIS — I10 Essential (primary) hypertension: Secondary | ICD-10-CM | POA: Diagnosis not present

## 2021-05-13 DIAGNOSIS — B029 Zoster without complications: Secondary | ICD-10-CM | POA: Diagnosis not present

## 2021-05-31 ENCOUNTER — Ambulatory Visit: Payer: Medicare Other | Admitting: Cardiology

## 2021-07-22 DIAGNOSIS — N1831 Chronic kidney disease, stage 3a: Secondary | ICD-10-CM | POA: Diagnosis not present

## 2021-07-22 DIAGNOSIS — E1121 Type 2 diabetes mellitus with diabetic nephropathy: Secondary | ICD-10-CM | POA: Diagnosis not present

## 2021-07-22 DIAGNOSIS — E7800 Pure hypercholesterolemia, unspecified: Secondary | ICD-10-CM | POA: Diagnosis not present

## 2021-07-22 DIAGNOSIS — I10 Essential (primary) hypertension: Secondary | ICD-10-CM | POA: Diagnosis not present

## 2021-07-22 DIAGNOSIS — K219 Gastro-esophageal reflux disease without esophagitis: Secondary | ICD-10-CM | POA: Diagnosis not present

## 2021-07-22 DIAGNOSIS — Z0000 Encounter for general adult medical examination without abnormal findings: Secondary | ICD-10-CM | POA: Diagnosis not present

## 2021-07-22 DIAGNOSIS — M810 Age-related osteoporosis without current pathological fracture: Secondary | ICD-10-CM | POA: Diagnosis not present

## 2021-07-22 DIAGNOSIS — I4891 Unspecified atrial fibrillation: Secondary | ICD-10-CM | POA: Diagnosis not present

## 2021-07-22 DIAGNOSIS — M1990 Unspecified osteoarthritis, unspecified site: Secondary | ICD-10-CM | POA: Diagnosis not present

## 2021-08-06 ENCOUNTER — Other Ambulatory Visit: Payer: Self-pay

## 2021-08-06 ENCOUNTER — Encounter: Payer: Self-pay | Admitting: Cardiology

## 2021-08-06 ENCOUNTER — Ambulatory Visit: Payer: Medicare Other | Admitting: Cardiology

## 2021-08-06 VITALS — BP 140/80 | HR 74 | Ht 66.0 in | Wt 219.0 lb

## 2021-08-06 DIAGNOSIS — Z79899 Other long term (current) drug therapy: Secondary | ICD-10-CM

## 2021-08-06 DIAGNOSIS — I480 Paroxysmal atrial fibrillation: Secondary | ICD-10-CM

## 2021-08-06 DIAGNOSIS — E1100 Type 2 diabetes mellitus with hyperosmolarity without nonketotic hyperglycemic-hyperosmolar coma (NKHHC): Secondary | ICD-10-CM

## 2021-08-06 DIAGNOSIS — E669 Obesity, unspecified: Secondary | ICD-10-CM

## 2021-08-06 DIAGNOSIS — I10 Essential (primary) hypertension: Secondary | ICD-10-CM | POA: Diagnosis not present

## 2021-08-06 NOTE — Patient Instructions (Signed)
Medication Instructions:  Your physician recommends that you continue on your current medications as directed. Please refer to the Current Medication list given to you today.  *If you need a refill on your cardiac medications before your next appointment, please call your pharmacy*   Lab Work: Your physician recommends that you have a flecainide level today in the office.  If you have labs (blood work) drawn today and your tests are completely normal, you will receive your results only by: [place] (if you have MyChart) OR A paper copy in the mail If you have any lab test that is abnormal or we need to change your treatment, we will call you to review the results.   Testing/Procedures: None ordered   Follow-Up: At [facility], you and your health needs are our priority.  As part of our continuing mission to provide you with exceptional heart care, we have created designated Provider Care Teams.  These Care Teams include your primary Cardiologist (physician) and Advanced Practice Providers (APPs -  Physician Assistants and Nurse Practitioners) who all work together to provide you with the care you need, when you need it.  We recommend signing up for the patient portal called "MyChart".  Sign up information is provided on this After Visit Summary.  MyChart is used to connect with patients for Virtual Visits (Telemedicine).  Patients are able to view lab/test results, encounter notes, upcoming appointments, etc.  Non-urgent messages can be sent to your provider as well.   To learn more about what you can do with MyChart, go to [email or web address].    Your next appointment:   12 month(s)  The format for your next appointment:   In Person  Provider:   Jyl Heinz, MD   Other Instructions NA

## 2021-08-06 NOTE — Addendum Note (Signed)
Addended by: Truddie Hidden on: 08/06/2021 03:04 PM   Modules accepted: Orders
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
## 2021-08-13 DIAGNOSIS — E1121 Type 2 diabetes mellitus with diabetic nephropathy: Secondary | ICD-10-CM | POA: Diagnosis not present

## 2021-08-13 DIAGNOSIS — N1831 Chronic kidney disease, stage 3a: Secondary | ICD-10-CM | POA: Diagnosis not present

## 2021-08-17 LAB — FLECAINIDE LEVEL

## 2021-10-17 DIAGNOSIS — L820 Inflamed seborrheic keratosis: Secondary | ICD-10-CM | POA: Diagnosis not present

## 2021-10-23 DIAGNOSIS — E1121 Type 2 diabetes mellitus with diabetic nephropathy: Secondary | ICD-10-CM | POA: Diagnosis not present

## 2021-10-23 DIAGNOSIS — N1831 Chronic kidney disease, stage 3a: Secondary | ICD-10-CM | POA: Diagnosis not present

## 2021-10-23 DIAGNOSIS — Z23 Encounter for immunization: Secondary | ICD-10-CM | POA: Diagnosis not present

## 2021-12-12 DIAGNOSIS — Z1231 Encounter for screening mammogram for malignant neoplasm of breast: Secondary | ICD-10-CM | POA: Diagnosis not present

## 2022-01-01 DIAGNOSIS — T148XXA Other injury of unspecified body region, initial encounter: Secondary | ICD-10-CM | POA: Diagnosis not present

## 2022-01-24 DIAGNOSIS — M25571 Pain in right ankle and joints of right foot: Secondary | ICD-10-CM | POA: Diagnosis not present

## 2022-01-24 DIAGNOSIS — E1165 Type 2 diabetes mellitus with hyperglycemia: Secondary | ICD-10-CM | POA: Diagnosis not present

## 2022-01-24 DIAGNOSIS — M25579 Pain in unspecified ankle and joints of unspecified foot: Secondary | ICD-10-CM | POA: Diagnosis not present

## 2022-01-24 DIAGNOSIS — M7989 Other specified soft tissue disorders: Secondary | ICD-10-CM | POA: Diagnosis not present

## 2022-01-30 DIAGNOSIS — M76821 Posterior tibial tendinitis, right leg: Secondary | ICD-10-CM | POA: Diagnosis not present

## 2022-02-18 DIAGNOSIS — M25571 Pain in right ankle and joints of right foot: Secondary | ICD-10-CM | POA: Diagnosis not present

## 2022-02-26 DIAGNOSIS — M25571 Pain in right ankle and joints of right foot: Secondary | ICD-10-CM | POA: Diagnosis not present

## 2022-03-05 DIAGNOSIS — M25571 Pain in right ankle and joints of right foot: Secondary | ICD-10-CM | POA: Diagnosis not present

## 2022-03-11 DIAGNOSIS — M25571 Pain in right ankle and joints of right foot: Secondary | ICD-10-CM | POA: Diagnosis not present

## 2022-03-12 DIAGNOSIS — M76821 Posterior tibial tendinitis, right leg: Secondary | ICD-10-CM | POA: Diagnosis not present

## 2022-03-18 DIAGNOSIS — M25571 Pain in right ankle and joints of right foot: Secondary | ICD-10-CM | POA: Diagnosis not present

## 2022-04-25 DIAGNOSIS — N1831 Chronic kidney disease, stage 3a: Secondary | ICD-10-CM | POA: Diagnosis not present

## 2022-04-25 DIAGNOSIS — E1165 Type 2 diabetes mellitus with hyperglycemia: Secondary | ICD-10-CM | POA: Diagnosis not present

## 2022-04-25 DIAGNOSIS — I10 Essential (primary) hypertension: Secondary | ICD-10-CM | POA: Diagnosis not present

## 2022-04-25 DIAGNOSIS — E1121 Type 2 diabetes mellitus with diabetic nephropathy: Secondary | ICD-10-CM | POA: Diagnosis not present

## 2022-06-03 DIAGNOSIS — N1831 Chronic kidney disease, stage 3a: Secondary | ICD-10-CM | POA: Diagnosis not present

## 2022-06-03 DIAGNOSIS — E1121 Type 2 diabetes mellitus with diabetic nephropathy: Secondary | ICD-10-CM | POA: Diagnosis not present

## 2022-06-19 ENCOUNTER — Encounter: Payer: Self-pay | Admitting: Cardiology

## 2022-06-19 ENCOUNTER — Ambulatory Visit: Payer: Medicare Other | Admitting: Cardiology

## 2022-06-19 VITALS — BP 147/74 | HR 69 | Ht 66.6 in | Wt 215.4 lb

## 2022-06-19 DIAGNOSIS — I480 Paroxysmal atrial fibrillation: Secondary | ICD-10-CM

## 2022-06-19 DIAGNOSIS — Z79899 Other long term (current) drug therapy: Secondary | ICD-10-CM | POA: Diagnosis not present

## 2022-06-19 DIAGNOSIS — E669 Obesity, unspecified: Secondary | ICD-10-CM | POA: Diagnosis not present

## 2022-06-19 DIAGNOSIS — I10 Essential (primary) hypertension: Secondary | ICD-10-CM | POA: Diagnosis not present

## 2022-06-19 DIAGNOSIS — E118 Type 2 diabetes mellitus with unspecified complications: Secondary | ICD-10-CM

## 2022-06-19 DIAGNOSIS — E782 Mixed hyperlipidemia: Secondary | ICD-10-CM

## 2022-06-19 MED ORDER — AMIODARONE HCL 200 MG PO TABS: 100.0000 mg | ORAL_TABLET | Freq: Every day | ORAL | 3 refills | Status: DC

## 2022-06-19 NOTE — Patient Instructions (Signed)
Medication Instructions:  Your physician has recommended you make the following change in your medication:   Stop Flecainide  Start Amiodarone 200 mg daily for 1 week then decrease to 100 mg daily.  *If you need a refill on your cardiac medications before your next appointment, please call your pharmacy*   Lab Work: Your physician recommends that you BMET, CBC and TSH.  If you have labs (blood work) drawn today and your tests are completely normal, you will receive your results only by: [facility] (if you have MyChart) OR A paper copy in the mail If you have any lab test that is abnormal or we need to change your treatment, we will call you to review the results.   Testing/Procedures: A chest x-ray takes a picture of the organs and structures inside the chest, including the heart, lungs, and blood vessels. This test can show several things, including, whether the heart is enlarges; whether fluid is building up in the lungs; and whether pacemaker / defibrillator leads are still in place.    Follow-Up: At [facility], you and your health needs are our priority.  As part of our continuing mission to provide you with exceptional heart care, we have created designated Provider Care Teams.  These Care Teams include your primary Cardiologist (physician) and Advanced Practice Providers (APPs -  Physician Assistants and Nurse Practitioners) who all work together to provide you with the care you need, when you need it.  We recommend signing up for the patient portal called "MyChart".  Sign up information is provided on this After Visit Summary.  MyChart is used to connect with patients for Virtual Visits (Telemedicine).  Patients are able to view lab/test results, encounter notes, upcoming appointments, etc.  Non-urgent messages can be sent to your provider as well.   To learn more about what you can do with MyChart, go to [email or web address].    Your next appointment:   3  month(s)  The format for your next appointment:   In Person  Provider:   Jyl Heinz, MD   Other Instructions Amiodarone Tablets What is this medication? AMIODARONE (a MEE oh da rone) prevents and treats a fast or irregular heartbeat (arrhythmia). It works by slowing down overactive electric signals in the heart, which stabilizes your heart rhythm. It belongs to a group of medications called antiarrhythmics. This medicine may be used for other purposes; ask your health care provider or pharmacist if you have questions. COMMON BRAND NAME(S): Cordarone, Pacerone What should I tell my care team before I take this medication? They need to know if you have any of these conditions: Liver disease Lung disease Other heart problems Thyroid disease An unusual or allergic reaction to amiodarone, iodine, other medications, foods, dyes, or preservatives Pregnant or trying to get pregnant Breast-feeding How should I use this medication? Take this medication by mouth with a glass of water. Follow the directions on the prescription label. You can take this medication with or without food. However, you should always take it the same way each time. Take your doses at regular intervals. Do not take your medication more often than directed. Do not stop taking except on the advice of your care team. A special MedGuide will be given to you by the pharmacist with each prescription and refill. Be sure to read this information carefully each time. Talk to your care team regarding the use of this medication in children. Special care may be needed. Overdosage: If you think you have taken  too much of this medicine contact a poison control center or emergency room at once. NOTE: This medicine is only for you. Do not share this medicine with others. What if I miss a dose? If you miss a dose, take it as soon as you can. If it is almost time for your next dose, take only that dose. Do not take double or extra  doses. What may interact with this medication? Do not take this medication with any of the following: Abarelix Apomorphine Arsenic trioxide Certain antibiotics like erythromycin, gemifloxacin, levofloxacin, pentamidine Certain medications for depression like amoxapine, tricyclic antidepressants Certain medications for fungal infections like fluconazole, itraconazole, ketoconazole, posaconazole, voriconazole Certain medications for irregular heartbeat like disopyramide, dronedarone, ibutilide, propafenone, sotalol Certain medications for malaria like chloroquine, halofantrine Cisapride Droperidol Haloperidol [place] Maprotiline Methadone Phenothiazines like chlorpromazine, mesoridazine, thioridazine Pimozide Ranolazine Red yeast rice Vardenafil This medication may also interact with the following: Antiviral medications for HIV or AIDS Certain medications for blood pressure, heart disease, irregular heart beat Certain medications for cholesterol like atorvastatin, cerivastatin, lovastatin, simvastatin Certain medications for hepatitis C like sofosbuvir and ledipasvir; sofosbuvir Certain medications for seizures like phenytoin Certain medications for thyroid problems Certain medications that treat or prevent blood clots like warfarin Cholestyramine Cimetidine Clopidogrel Cyclosporine Dextromethorphan Diuretics Dofetilide Fentanyl General anesthetics Grapefruit juice Lidocaine Loratadine Methotrexate Other medications that prolong the QT interval (cause an abnormal heart rhythm) Procainamide Quinidine Rifabutin, rifampin, or rifapentine St. John's Wort Trazodone Ziprasidone This list may not describe all possible interactions. Give your health care provider a list of all the medicines, herbs, non-prescription drugs, or dietary supplements you use. Also tell them if you smoke, drink alcohol, or use illegal drugs. Some items may interact with your medicine. What should I  watch for while using this medication? Your condition will be monitored closely when you first begin therapy. Often, this medication is first started in a hospital or other monitored health care setting. Once you are on maintenance therapy, visit your care team for regular checks on your progress. Because your condition and use of this medication carry some risk, it is a good idea to carry an identification card, necklace or bracelet with details of your condition, medications, and care team. You may get drowsy or dizzy. Do not drive, use machinery, or do anything that needs mental alertness until you know how this medication affects you. Do not stand or sit up quickly, especially if you are an older patient. This reduces the risk of dizzy or fainting spells. This medication can make you more sensitive to the sun. Keep out of the sun. If you cannot avoid being in the sun, wear protective clothing and use sunscreen. Do not use sun lamps or tanning beds/booths. You should have regular eye exams before and during treatment. Call your care team if you have blurred vision, see halos, or your eyes become sensitive to light. Your eyes may get dry. It may be helpful to use a lubricating eye solution or artificial tears solution. If you are going to have surgery or a procedure that requires contrast dyes, tell your care team that you are taking this medication. What side effects may I notice from receiving this medication? Side effects that you should report to your care team as soon as possible: Allergic reactions--skin rash, itching, hives, swelling of the face, lips, tongue, or throat Bluish-gray skin Change in vision such as blurry vision, seeing halos around lights, vision loss Heart failure--shortness of breath, swelling of the ankles, feet,  or hands, sudden weight gain, unusual weakness or fatigue Heart rhythm changes--fast or irregular heartbeat, dizziness, feeling faint or lightheaded, chest pain,  trouble breathing High thyroid levels (hyperthyroidism)--fast or irregular heartbeat, weight loss, excessive sweating or sensitivity to heat, tremors or shaking, anxiety, nervousness, irregular menstrual cycle or spotting Liver injury--right upper belly pain, loss of appetite, nausea, light-colored stool, dark yellow or brown urine, yellowing skin or eyes, unusual weakness or fatigue Low thyroid levels (hypothyroidism)--unusual weakness or fatigue, sensitivity to cold, constipation, hair loss, dry skin, weight gain, feelings of depression Lung injury--shortness of breath or trouble breathing, cough, spitting up blood, chest pain, fever Pain, tingling, or numbness in the hands or feet, muscle weakness, trouble walking, loss of balance or coordination Side effects that usually do not require medical attention (report to your care team if they continue or are bothersome): Nausea Vomiting This list may not describe all possible side effects. Call your doctor for medical advice about side effects. You may report side effects to FDA at 1-800-FDA-1088. Where should I keep my medication? Keep out of the reach of children and pets. Store at room temperature between 20 and 25 degrees C (68 and 77 degrees F). Protect from light. Keep container tightly closed. Throw away any unused medication after the expiration date. NOTE: This sheet is a summary. It may not cover all possible information. If you have questions about this medicine, talk to your doctor, pharmacist, or health care provider.  2023 Elsevier/Gold Standard (2021-01-18 00:00:00)
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
## 2022-06-19 NOTE — Addendum Note (Signed)
Addended by: Truddie Hidden on: 06/19/2022 01:37 PM   Modules accepted: Orders

## 2022-06-20 LAB — CBC
Hematocrit: 40.8 % (ref 34.0–46.6)
Hemoglobin: 13.8 g/dL (ref 11.1–15.9)
MCH: 31.9 pg (ref 26.6–33.0)
MCHC: 33.8 g/dL (ref 31.5–35.7)
MCV: 94 fL (ref 79–97)
Platelets: 300 10*3/uL (ref 150–450)
RBC: 4.32 x10E6/uL (ref 3.77–5.28)
RDW: 13 % (ref 11.7–15.4)
WBC: 5.2 10*3/uL (ref 3.4–10.8)

## 2022-06-20 LAB — BASIC METABOLIC PANEL
BUN/Creatinine Ratio: 19 (ref 12–28)
BUN: 19 mg/dL (ref 8–27)
CO2: 28 mmol/L (ref 20–29)
Calcium: 9.5 mg/dL (ref 8.7–10.3)
Chloride: 83 mmol/L — ABNORMAL LOW (ref 96–106)
Creatinine, Ser: 1.01 mg/dL — ABNORMAL HIGH (ref 0.57–1.00)
Glucose: 175 mg/dL — ABNORMAL HIGH (ref 70–99)
Potassium: 4.1 mmol/L (ref 3.5–5.2)
Sodium: 127 mmol/L — ABNORMAL LOW (ref 134–144)
eGFR: 57 mL/min/{1.73_m2} — ABNORMAL LOW (ref >59)

## 2022-06-20 LAB — TSH: TSH: 1.63 u[IU]/mL (ref 0.450–4.500)

## 2022-06-23 ENCOUNTER — Telehealth: Payer: Self-pay | Admitting: Cardiology

## 2022-06-23 DIAGNOSIS — I4891 Unspecified atrial fibrillation: Secondary | ICD-10-CM | POA: Diagnosis not present

## 2022-06-23 DIAGNOSIS — Z79899 Other long term (current) drug therapy: Secondary | ICD-10-CM | POA: Diagnosis not present

## 2022-06-23 DIAGNOSIS — I480 Paroxysmal atrial fibrillation: Secondary | ICD-10-CM | POA: Diagnosis not present

## 2022-06-23 NOTE — Telephone Encounter (Signed)
Patient is returning call to discuss lab results. 

## 2022-06-23 NOTE — Telephone Encounter (Signed)
Results reviewed with pt as per Dr. Revankar's note.  Pt verbalized understanding and had no additional questions. Routed to PCP.  

## 2022-06-26 ENCOUNTER — Telehealth: Payer: Self-pay

## 2022-06-26 NOTE — Telephone Encounter (Signed)
Results reviewed with pt as per Dr. Revankar's note.  Pt verbalized understanding and had no additional questions. Routed to PCP.  

## 2022-07-14 DIAGNOSIS — I4891 Unspecified atrial fibrillation: Secondary | ICD-10-CM | POA: Diagnosis not present

## 2022-07-21 ENCOUNTER — Ambulatory Visit (INDEPENDENT_AMBULATORY_CARE_PROVIDER_SITE_OTHER): Payer: Medicare Other

## 2022-07-21 ENCOUNTER — Ambulatory Visit: Payer: Medicare Other | Admitting: Cardiology

## 2022-07-21 ENCOUNTER — Encounter: Payer: Self-pay | Admitting: Cardiology

## 2022-07-21 VITALS — BP 146/72 | HR 77 | Ht 66.6 in | Wt 216.0 lb

## 2022-07-21 DIAGNOSIS — E669 Obesity, unspecified: Secondary | ICD-10-CM

## 2022-07-21 DIAGNOSIS — I10 Essential (primary) hypertension: Secondary | ICD-10-CM | POA: Diagnosis not present

## 2022-07-21 DIAGNOSIS — I480 Paroxysmal atrial fibrillation: Secondary | ICD-10-CM

## 2022-07-21 DIAGNOSIS — E782 Mixed hyperlipidemia: Secondary | ICD-10-CM

## 2022-07-21 MED ORDER — AMIODARONE HCL 200 MG PO TABS: 100.0000 mg | ORAL_TABLET | Freq: Every day | ORAL | 3 refills | Status: DC
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
## 2022-07-21 NOTE — Patient Instructions (Signed)
Medication Instructions:  Your physician has recommended you make the following change in your medication:   Decrease Amiodarone to 100 mg daily.  *If you need a refill on your cardiac medications before your next appointment, please call your pharmacy*   Lab Work: Your physician recommends that you have labs done in the office today. Your test included  basic metabolic panel, TSH and liver function.  If you have labs (blood work) drawn today and your tests are completely normal, you will receive your results only by: [place] (if you have MyChart) OR A paper copy in the mail If you have any lab test that is abnormal or we need to change your treatment, we will call you to review the results.   Testing/Procedures:  WHY IS MY DOCTOR PRESCRIBING ZIO? The Zio system is proven and trusted by physicians to detect and diagnose irregular heart rhythms -- and has been prescribed to hundreds of thousands of patients.  The FDA has cleared the Zio system to monitor for many different kinds of irregular heart rhythms. In a study, physicians were able to reach a diagnosis 90% of the time with the Zio system1.  You can wear the Zio monitor -- a small, discreet, comfortable patch -- during your normal day-to-day activity, including while you sleep, shower, and exercise, while it records every single heartbeat for analysis.  1Barrett, P., et al. Comparison of 24 Hour Holter Monitoring Versus 14 Day Novel Adhesive Patch Electrocardiographic Monitoring. [employer], 2014.  ZIO VS. HOLTER MONITORING The Zio monitor can be comfortably worn for up to 14 days. Holter monitors can be worn for 24 to 48 hours, limiting the time to record any irregular heart rhythms you may have. Zio is able to capture data for the 51% of patients who have their first symptom-triggered arrhythmia after 48 hours.1  LIVE WITHOUT RESTRICTIONS The Zio ambulatory cardiac monitor is a small, unobtrusive, and  water-resistant patch--you might even forget you're wearing it. The Zio monitor records and stores every beat of your heart, whether you're sleeping, working out, or showering. Wear the monitor for 14 days, remove 08/04/22.   Follow-Up: At [facility], you and your health needs are our priority.  As part of our continuing mission to provide you with exceptional heart care, we have created designated Provider Care Teams.  These Care Teams include your primary Cardiologist (physician) and Advanced Practice Providers (APPs -  Physician Assistants and Nurse Practitioners) who all work together to provide you with the care you need, when you need it.  We recommend signing up for the patient portal called "MyChart".  Sign up information is provided on this After Visit Summary.  MyChart is used to connect with patients for Virtual Visits (Telemedicine).  Patients are able to view lab/test results, encounter notes, upcoming appointments, etc.  Non-urgent messages can be sent to your provider as well.   To learn more about what you can do with MyChart, go to [email or web address].    Your next appointment:   3 month(s)  The format for your next appointment:   In Person  Provider:   Jyl Heinz, MD   Other Instructions NA

## 2022-07-30 DIAGNOSIS — E1121 Type 2 diabetes mellitus with diabetic nephropathy: Secondary | ICD-10-CM | POA: Diagnosis not present

## 2022-07-30 DIAGNOSIS — M159 Polyosteoarthritis, unspecified: Secondary | ICD-10-CM | POA: Diagnosis not present

## 2022-07-30 DIAGNOSIS — K219 Gastro-esophageal reflux disease without esophagitis: Secondary | ICD-10-CM | POA: Diagnosis not present

## 2022-07-30 DIAGNOSIS — I10 Essential (primary) hypertension: Secondary | ICD-10-CM | POA: Diagnosis not present

## 2022-07-30 DIAGNOSIS — B0229 Other postherpetic nervous system involvement: Secondary | ICD-10-CM | POA: Diagnosis not present

## 2022-07-30 DIAGNOSIS — E7800 Pure hypercholesterolemia, unspecified: Secondary | ICD-10-CM | POA: Diagnosis not present

## 2022-07-30 DIAGNOSIS — G894 Chronic pain syndrome: Secondary | ICD-10-CM | POA: Diagnosis not present

## 2022-07-30 DIAGNOSIS — I4891 Unspecified atrial fibrillation: Secondary | ICD-10-CM | POA: Diagnosis not present

## 2022-07-30 DIAGNOSIS — N1831 Chronic kidney disease, stage 3a: Secondary | ICD-10-CM | POA: Diagnosis not present

## 2022-07-30 DIAGNOSIS — Z0000 Encounter for general adult medical examination without abnormal findings: Secondary | ICD-10-CM | POA: Diagnosis not present

## 2022-07-30 DIAGNOSIS — M810 Age-related osteoporosis without current pathological fracture: Secondary | ICD-10-CM | POA: Diagnosis not present

## 2022-08-11 DIAGNOSIS — I480 Paroxysmal atrial fibrillation: Secondary | ICD-10-CM | POA: Diagnosis not present

## 2022-08-12 DIAGNOSIS — B029 Zoster without complications: Secondary | ICD-10-CM | POA: Diagnosis not present

## 2022-09-23 DIAGNOSIS — M79671 Pain in right foot: Secondary | ICD-10-CM | POA: Diagnosis not present

## 2022-09-25 DIAGNOSIS — M79671 Pain in right foot: Secondary | ICD-10-CM | POA: Diagnosis not present

## 2022-10-10 ENCOUNTER — Ambulatory Visit: Payer: Medicare Other | Admitting: Cardiology

## 2022-10-28 ENCOUNTER — Ambulatory Visit: Payer: Medicare Other | Attending: Cardiology | Admitting: Cardiology

## 2022-10-28 ENCOUNTER — Encounter: Payer: Self-pay | Admitting: Cardiology

## 2022-10-28 VITALS — BP 142/66 | HR 60 | Ht 66.6 in | Wt 224.2 lb

## 2022-10-28 DIAGNOSIS — I10 Essential (primary) hypertension: Secondary | ICD-10-CM | POA: Diagnosis not present

## 2022-10-28 DIAGNOSIS — I480 Paroxysmal atrial fibrillation: Secondary | ICD-10-CM

## 2022-10-28 DIAGNOSIS — E118 Type 2 diabetes mellitus with unspecified complications: Secondary | ICD-10-CM | POA: Diagnosis not present

## 2022-10-28 DIAGNOSIS — E669 Obesity, unspecified: Secondary | ICD-10-CM | POA: Diagnosis not present
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
## 2022-10-28 NOTE — Patient Instructions (Signed)

## 2022-10-29 ENCOUNTER — Encounter: Payer: Self-pay | Admitting: Internal Medicine

## 2022-10-29 ENCOUNTER — Ambulatory Visit (INDEPENDENT_AMBULATORY_CARE_PROVIDER_SITE_OTHER): Payer: Medicare Other | Admitting: Internal Medicine

## 2022-10-29 VITALS — BP 124/62 | HR 71 | Temp 98.6°F | Resp 18 | Ht 66.0 in | Wt 223.0 lb

## 2022-10-29 DIAGNOSIS — N1831 Chronic kidney disease, stage 3a: Secondary | ICD-10-CM

## 2022-10-29 DIAGNOSIS — M79673 Pain in unspecified foot: Secondary | ICD-10-CM

## 2022-10-29 DIAGNOSIS — Z23 Encounter for immunization: Secondary | ICD-10-CM | POA: Diagnosis not present

## 2022-10-29 DIAGNOSIS — I480 Paroxysmal atrial fibrillation: Secondary | ICD-10-CM

## 2022-10-29 DIAGNOSIS — G894 Chronic pain syndrome: Secondary | ICD-10-CM

## 2022-10-29 DIAGNOSIS — E1121 Type 2 diabetes mellitus with diabetic nephropathy: Secondary | ICD-10-CM | POA: Diagnosis not present

## 2022-10-29 HISTORY — DX: Pain in unspecified foot: M79.673

## 2022-10-29 HISTORY — DX: Chronic kidney disease, stage 3a: N18.31

## 2022-10-29 HISTORY — DX: Chronic pain syndrome: G89.4

## 2022-10-29 MED ORDER — TRAMADOL HCL 50 MG PO TABS: 50.0000 mg | ORAL_TABLET | Freq: Two times a day (BID) | ORAL | 0 refills | Status: DC | PRN

## 2022-10-29 NOTE — Assessment & Plan Note (Signed)
She has arthritic pain in her back, knees and hips. She can use tylenol and her gabapentin.  I did check the [place] controlled substance registry.  We will give her #30 tabs of tramadol to be used as needed.

## 2022-10-29 NOTE — Addendum Note (Signed)
Addended by: Townsend Roger on: 10/29/2022 03:50 PM   Modules accepted: Orders

## 2022-10-29 NOTE — Addendum Note (Signed)
Addended byLucile Shutters on: 10/29/2022 02:27 PM   Modules accepted: Orders

## 2022-10-29 NOTE — Assessment & Plan Note (Signed)
She has diabetic nephropathy where her last HgBA1c was controlled.  We will check a HgBA1c on her today.

## 2022-10-29 NOTE — Assessment & Plan Note (Signed)
I have reviewed her notes and zio patch from cardiology.  She is currently rate controlled but does go in and out of A. Fib.  We will continue with metoprolol and amiodarone and continue anticoagulation with xarelto.
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
## 2022-10-29 NOTE — Assessment & Plan Note (Signed)
She wishes to be referred to the podiatrist for her heel spur.

## 2022-10-30 LAB — HEMOGLOBIN A1C
Est. average glucose Bld gHb Est-mCnc: 143 mg/dL
Hgb A1c MFr Bld: 6.6 % — ABNORMAL HIGH (ref 4.8–5.6)

## 2022-11-26 ENCOUNTER — Other Ambulatory Visit: Payer: Self-pay

## 2022-11-26 MED ORDER — GABAPENTIN 100 MG PO CAPS: 200.0000 mg | ORAL_CAPSULE | Freq: Every evening | ORAL | 1 refills | Status: DC

## 2023-01-21 ENCOUNTER — Ambulatory Visit: Payer: Medicare Other | Admitting: Internal Medicine

## 2023-01-28 ENCOUNTER — Encounter: Payer: Self-pay | Admitting: Internal Medicine

## 2023-01-28 ENCOUNTER — Ambulatory Visit: Payer: Medicare Other | Admitting: Internal Medicine

## 2023-01-28 VITALS — BP 128/62 | HR 59 | Temp 97.6°F | Resp 18 | Ht 66.5 in | Wt 227.0 lb

## 2023-01-28 DIAGNOSIS — R1310 Dysphagia, unspecified: Secondary | ICD-10-CM

## 2023-01-28 DIAGNOSIS — E1121 Type 2 diabetes mellitus with diabetic nephropathy: Secondary | ICD-10-CM

## 2023-01-28 DIAGNOSIS — G894 Chronic pain syndrome: Secondary | ICD-10-CM | POA: Diagnosis not present

## 2023-01-28 DIAGNOSIS — N1831 Chronic kidney disease, stage 3a: Secondary | ICD-10-CM

## 2023-01-28 DIAGNOSIS — I10 Essential (primary) hypertension: Secondary | ICD-10-CM

## 2023-01-28 HISTORY — DX: Dysphagia, unspecified: R13.10

## 2023-01-28 MED ORDER — EMPAGLIFLOZIN 25 MG PO TABS: 25.0000 mg | ORAL_TABLET | Freq: Every day | ORAL | 3 refills | Status: DC

## 2023-01-28 MED ORDER — TRAMADOL HCL 50 MG PO TABS: 50.0000 mg | ORAL_TABLET | Freq: Two times a day (BID) | ORAL | 0 refills | Status: DC | PRN

## 2023-01-28 MED ORDER — PANTOPRAZOLE SODIUM 40 MG PO TBEC: 40.0000 mg | DELAYED_RELEASE_TABLET | Freq: Every day | ORAL | 2 refills | Status: DC

## 2023-01-28 NOTE — Assessment & Plan Note (Signed)
Her BP is controlled.  We will cotninue her current BP meds.

## 2023-01-28 NOTE — Assessment & Plan Note (Signed)
She is having dysphagia at this time.  I am going to protonix and refer her back to GI at this time.

## 2023-01-28 NOTE — Assessment & Plan Note (Signed)
She wants to wait until her next visit to recheck her hgBA1c.  She cannot take [place] or farxiga but we will try to write her for jardiance for her nephropathy.

## 2023-01-28 NOTE — Assessment & Plan Note (Signed)
Plan as descirbed agove.
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
## 2023-01-28 NOTE — Assessment & Plan Note (Signed)
I reviewed her [place] controlled substance registry.  We will refill her tramadol at this time.

## 2023-02-11 ENCOUNTER — Other Ambulatory Visit: Payer: Self-pay | Admitting: Internal Medicine

## 2023-03-03 ENCOUNTER — Other Ambulatory Visit: Payer: Self-pay | Admitting: Internal Medicine

## 2023-03-04 ENCOUNTER — Other Ambulatory Visit: Payer: Self-pay | Admitting: Internal Medicine

## 2023-03-20 ENCOUNTER — Encounter: Payer: Self-pay | Admitting: Internal Medicine

## 2023-03-20 ENCOUNTER — Ambulatory Visit: Payer: Medicare Other | Admitting: Internal Medicine

## 2023-03-20 VITALS — BP 140/82 | HR 62 | Temp 97.2°F | Resp 16 | Ht 66.0 in | Wt 221.4 lb

## 2023-03-20 DIAGNOSIS — M79671 Pain in right foot: Secondary | ICD-10-CM

## 2023-03-20 HISTORY — DX: Pain in right foot: M79.671

## 2023-03-20 MED ORDER — METHYLPREDNISOLONE 4 MG PO TBPK: ORAL_TABLET | ORAL | 0 refills | Status: DC

## 2023-03-20 MED ORDER — TRAMADOL HCL 50 MG PO TABS: 50.0000 mg | ORAL_TABLET | Freq: Two times a day (BID) | ORAL | 0 refills | Status: DC | PRN

## 2023-03-20 NOTE — Assessment & Plan Note (Signed)
She may have some arthritis of her ankle.  We will start her on a medrol dose pak and get another xray of her right foot/ankle. I reviewed her [place] controlled substance registry and we will refill her tramadol as well.
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
## 2023-03-23 DIAGNOSIS — M79671 Pain in right foot: Secondary | ICD-10-CM | POA: Diagnosis not present

## 2023-03-23 DIAGNOSIS — M19071 Primary osteoarthritis, right ankle and foot: Secondary | ICD-10-CM | POA: Diagnosis not present

## 2023-04-01 ENCOUNTER — Other Ambulatory Visit: Payer: Self-pay | Admitting: Internal Medicine

## 2023-04-09 ENCOUNTER — Other Ambulatory Visit: Payer: Self-pay | Admitting: Internal Medicine

## 2023-04-15 ENCOUNTER — Ambulatory Visit: Payer: Medicare Other | Admitting: Internal Medicine

## 2023-04-15 ENCOUNTER — Encounter: Payer: Self-pay | Admitting: Internal Medicine

## 2023-04-15 VITALS — BP 158/78 | HR 63 | Temp 97.4°F | Resp 16 | Ht 66.0 in | Wt 217.8 lb

## 2023-04-15 DIAGNOSIS — R0602 Shortness of breath: Secondary | ICD-10-CM

## 2023-04-15 HISTORY — DX: Shortness of breath: R06.02

## 2023-04-15 NOTE — Assessment & Plan Note (Signed)
She has SOB at rest.  Currently she is not having any volume overload.  I did an EKG and this shows Atrial flutter with a HR of 58.  She has some Tw[place] inversion that is nonspecific in V2-V4.  We will get her setup to see cardilogy this week but I am going to obtain a CBC, CMP, Mg level and troponin I.  We will also obtain a CXR.
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
## 2023-04-16 DIAGNOSIS — R0602 Shortness of breath: Secondary | ICD-10-CM | POA: Diagnosis not present

## 2023-04-16 DIAGNOSIS — R9431 Abnormal electrocardiogram [ECG] [EKG]: Secondary | ICD-10-CM | POA: Diagnosis not present
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
## 2023-04-17 ENCOUNTER — Ambulatory Visit: Payer: Medicare Other | Attending: Cardiology | Admitting: Cardiology

## 2023-04-17 ENCOUNTER — Encounter: Payer: Self-pay | Admitting: Cardiology

## 2023-04-17 VITALS — BP 138/78 | HR 65 | Ht 66.0 in | Wt 218.0 lb

## 2023-04-17 DIAGNOSIS — I10 Essential (primary) hypertension: Secondary | ICD-10-CM | POA: Diagnosis not present

## 2023-04-17 DIAGNOSIS — I480 Paroxysmal atrial fibrillation: Secondary | ICD-10-CM | POA: Diagnosis not present

## 2023-04-17 DIAGNOSIS — R0602 Shortness of breath: Secondary | ICD-10-CM

## 2023-04-17 DIAGNOSIS — I2510 Atherosclerotic heart disease of native coronary artery without angina pectoris: Secondary | ICD-10-CM | POA: Diagnosis not present

## 2023-04-17 DIAGNOSIS — E871 Hypo-osmolality and hyponatremia: Secondary | ICD-10-CM | POA: Diagnosis not present

## 2023-04-17 NOTE — Patient Instructions (Signed)
Medication Instructions:  Your physician recommends that you continue on your current medications as directed. Please refer to the Current Medication list given to you today.  *If you need a refill on your cardiac medications before your next appointment, please call your pharmacy*   Lab Work: Your physician recommends that you return for lab work in:   Labs today: Pro BNP Labs in 2 weeks: BMP  If you have labs (blood work) drawn today and your tests are completely normal, you will receive your results only by: MyChart Message (if you have MyChart) OR A paper copy in the mail If you have any lab test that is abnormal or we need to change your treatment, we will call you to review the results.   Testing/Procedures: Your physician has requested that you have an echocardiogram. Echocardiography is a painless test that uses sound waves to create images of your heart. It provides your doctor with information about the size and shape of your heart and how well your heart's chambers and valves are working. This procedure takes approximately one hour. There are no restrictions for this procedure. Please do NOT wear cologne, perfume, aftershave, or lotions (deodorant is allowed). Please arrive 15 minutes prior to your appointment time.    Follow-Up: At [facility], you and your health needs are our priority.  As part of our continuing mission to provide you with exceptional heart care, we have created designated Provider Care Teams.  These Care Teams include your primary Cardiologist (physician) and Advanced Practice Providers (APPs -  Physician Assistants and Nurse Practitioners) who all work together to provide you with the care you need, when you need it.  We recommend signing up for the patient portal called "MyChart".  Sign up information is provided on this After Visit Summary.  MyChart is used to connect with patients for Virtual Visits (Telemedicine).  Patients are able to view  lab/test results, encounter notes, upcoming appointments, etc.  Non-urgent messages can be sent to your provider as well.   To learn more about what you can do with MyChart, go to [email or web address].    Your next appointment:   1 month(s)  Provider:   Gypsy Balsam, MD    Other Instructions None

## 2023-04-18 LAB — PRO B NATRIURETIC PEPTIDE: NT-Pro BNP: 364 pg/mL (ref 0–738)

## 2023-04-20 ENCOUNTER — Telehealth: Payer: Self-pay

## 2023-04-20 NOTE — Telephone Encounter (Signed)
Patient notified of results.

## 2023-04-20 NOTE — Telephone Encounter (Signed)
-----   Message from Flossie Dibble, NP sent at 04/19/2023 11:40 AM EDT ----- Ms. Marie [name], Lab work on Friday was good, nothing that would be causing your SOB. We will see what the repeat echo says.  Best, Victorino Dike

## 2023-04-24 ENCOUNTER — Encounter: Payer: Self-pay | Admitting: Internal Medicine

## 2023-04-24 ENCOUNTER — Ambulatory Visit: Payer: Medicare Other | Admitting: Internal Medicine

## 2023-04-24 VITALS — BP 132/70 | HR 62 | Temp 97.7°F | Resp 16 | Ht 66.0 in | Wt 218.0 lb

## 2023-04-24 DIAGNOSIS — I2510 Atherosclerotic heart disease of native coronary artery without angina pectoris: Secondary | ICD-10-CM

## 2023-04-24 DIAGNOSIS — I10 Essential (primary) hypertension: Secondary | ICD-10-CM | POA: Diagnosis not present

## 2023-04-24 DIAGNOSIS — N1831 Chronic kidney disease, stage 3a: Secondary | ICD-10-CM | POA: Diagnosis not present

## 2023-04-24 DIAGNOSIS — G894 Chronic pain syndrome: Secondary | ICD-10-CM

## 2023-04-24 DIAGNOSIS — E1121 Type 2 diabetes mellitus with diabetic nephropathy: Secondary | ICD-10-CM

## 2023-04-24 HISTORY — DX: Atherosclerotic heart disease of native coronary artery without angina pectoris: I25.10

## 2023-04-24 MED ORDER — TRAMADOL HCL 50 MG PO TABS: 50.0000 mg | ORAL_TABLET | Freq: Two times a day (BID) | ORAL | 2 refills | Status: AC | PRN
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
## 2023-04-24 NOTE — Assessment & Plan Note (Signed)
Her [place] controlled registry was reivewed.  We will give her 3 months of medications.

## 2023-04-24 NOTE — Assessment & Plan Note (Signed)
We will check her hgBa1c today with goal <7%.

## 2023-04-24 NOTE — Assessment & Plan Note (Signed)
She has Stage IIIa CKD due to diabetes and HTN.  She is now on jardiance.  We will avoid nephrotoxins.

## 2023-04-24 NOTE — Assessment & Plan Note (Signed)
She is having some SOB but this probably not related to CAD.  Cardiology is following and will do another zio patch for her history of A.Fib.  They are going to obtain an ECHO.  Continue with risk factor modfication.

## 2023-04-24 NOTE — Assessment & Plan Note (Signed)
Her BP is well controlled.  We will continue her current meds. 

## 2023-04-25 LAB — CMP14 + ANION GAP
ALT: 22 IU/L (ref 0–32)
AST: 24 IU/L (ref 0–40)
Albumin/Globulin Ratio: 1.7 (ref 1.2–2.2)
Albumin: 4.3 g/dL (ref 3.8–4.8)
Alkaline Phosphatase: 63 IU/L (ref 44–121)
Anion Gap: 17 mmol/L (ref 10.0–18.0)
BUN/Creatinine Ratio: 15 (ref 12–28)
BUN: 24 mg/dL (ref 8–27)
Bilirubin Total: 0.6 mg/dL (ref 0.0–1.2)
CO2: 30 mmol/L — ABNORMAL HIGH (ref 20–29)
Calcium: 9.7 mg/dL (ref 8.7–10.3)
Chloride: 87 mmol/L — ABNORMAL LOW (ref 96–106)
Creatinine, Ser: 1.62 mg/dL — ABNORMAL HIGH (ref 0.57–1.00)
Globulin, Total: 2.5 g/dL (ref 1.5–4.5)
Glucose: 301 mg/dL — ABNORMAL HIGH (ref 70–99)
Potassium: 3.8 mmol/L (ref 3.5–5.2)
Sodium: 134 mmol/L (ref 134–144)
Total Protein: 6.8 g/dL (ref 6.0–8.5)
eGFR: 32 mL/min/{1.73_m2} — ABNORMAL LOW (ref >59)

## 2023-04-25 LAB — HEMOGLOBIN A1C
Est. average glucose Bld gHb Est-mCnc: 180 mg/dL
Hgb A1c MFr Bld: 7.9 % — ABNORMAL HIGH (ref 4.8–5.6)

## 2023-04-28 ENCOUNTER — Other Ambulatory Visit: Payer: Self-pay

## 2023-04-28 MED ORDER — SEMAGLUTIDE(0.25 OR 0.5MG/DOS) 2 MG/3ML ~~LOC~~ SOPN: 0.2500 mg | PEN_INJECTOR | SUBCUTANEOUS | 1 refills | Status: DC

## 2023-05-11 DIAGNOSIS — K2270 Barrett's esophagus without dysplasia: Secondary | ICD-10-CM | POA: Insufficient documentation

## 2023-05-11 HISTORY — DX: Barrett's esophagus without dysplasia: K22.70

## 2023-05-12 ENCOUNTER — Telehealth: Payer: Self-pay | Admitting: Cardiology

## 2023-05-12 ENCOUNTER — Ambulatory Visit: Payer: Medicare Other | Attending: Cardiology

## 2023-05-12 DIAGNOSIS — I10 Essential (primary) hypertension: Secondary | ICD-10-CM

## 2023-05-12 DIAGNOSIS — I480 Paroxysmal atrial fibrillation: Secondary | ICD-10-CM | POA: Diagnosis not present

## 2023-05-12 DIAGNOSIS — I2510 Atherosclerotic heart disease of native coronary artery without angina pectoris: Secondary | ICD-10-CM | POA: Diagnosis not present

## 2023-05-12 DIAGNOSIS — R0602 Shortness of breath: Secondary | ICD-10-CM | POA: Diagnosis not present

## 2023-05-12 LAB — ECHOCARDIOGRAM COMPLETE
Area-P 1/2: 4.21 cm2
MV M vel: 4.37 m/s
MV Peak grad: 76.4 mmHg
P 1/2 time: 518 ms
S' Lateral: 3.1 cm

## 2023-05-12 MED ORDER — AMIODARONE HCL 200 MG PO TABS: 100.0000 mg | ORAL_TABLET | Freq: Every day | ORAL | 2 refills | Status: DC

## 2023-05-12 NOTE — Telephone Encounter (Signed)
Patient needs refill on AMIODARONE 200MG  TABLET sent to [facility] in [place].

## 2023-05-14 ENCOUNTER — Other Ambulatory Visit: Payer: Self-pay | Admitting: Internal Medicine

## 2023-05-25 ENCOUNTER — Encounter: Payer: Self-pay | Admitting: Cardiology

## 2023-05-25 ENCOUNTER — Ambulatory Visit: Payer: Medicare Other | Attending: Cardiology | Admitting: Cardiology

## 2023-05-25 VITALS — BP 114/70 | HR 75 | Ht 66.5 in | Wt 217.0 lb

## 2023-05-25 DIAGNOSIS — Z7984 Long term (current) use of oral hypoglycemic drugs: Secondary | ICD-10-CM | POA: Diagnosis not present

## 2023-05-25 DIAGNOSIS — I480 Paroxysmal atrial fibrillation: Secondary | ICD-10-CM | POA: Diagnosis not present

## 2023-05-25 DIAGNOSIS — R079 Chest pain, unspecified: Secondary | ICD-10-CM | POA: Diagnosis not present

## 2023-05-25 DIAGNOSIS — E782 Mixed hyperlipidemia: Secondary | ICD-10-CM | POA: Diagnosis not present

## 2023-05-25 DIAGNOSIS — E1121 Type 2 diabetes mellitus with diabetic nephropathy: Secondary | ICD-10-CM | POA: Diagnosis not present

## 2023-05-25 DIAGNOSIS — I10 Essential (primary) hypertension: Secondary | ICD-10-CM

## 2023-05-25 DIAGNOSIS — I2510 Atherosclerotic heart disease of native coronary artery without angina pectoris: Secondary | ICD-10-CM | POA: Diagnosis not present

## 2023-05-25 NOTE — Patient Instructions (Addendum)
Medication Instructions:  Your physician recommends that you continue on your current medications as directed. Please refer to the Current Medication list given to you today.  *If you need a refill on your cardiac medications before your next appointment, please call your pharmacy*   Lab Work: None Ordered If you have labs (blood work) drawn today and your tests are completely normal, you will receive your results only by: MyChart Message (if you have MyChart) OR A paper copy in the mail If you have any lab test that is abnormal or we need to change your treatment, we will call you to review the results.   Testing/Procedures: Your physician has requested that you have a lexiscan myoview. For further information please visit [email or web address]. Please follow instruction sheet, as given.  The test will take approximately 3 to 4 hours to complete; you may bring reading material.  If someone comes with you to your appointment, they will need to remain in the main lobby due to limited space in the testing area.  How to prepare for your Myocardial Perfusion Test: Do not eat or drink 3 hours prior to your test, except you may have water. Do not consume products containing caffeine (regular or decaffeinated) 12 hours prior to your test. (ex: coffee, chocolate, sodas, tea). Do bring a list of your current medications with you.  If not listed below, you may take your medications as normal. Do wear comfortable clothes (no dresses or overalls) and walking shoes, tennis shoes preferred (No heels or open toe shoes are allowed). Do NOT wear cologne, perfume, aftershave, or lotions (deodorant is allowed). If these instructions are not followed, your test will have to be rescheduled.     Follow-Up: At [facility], you and your health needs are our priority.  As part of our continuing mission to provide you with exceptional heart care, we have created designated Provider Care Teams.  These Care Teams  include your primary Cardiologist (physician) and Advanced Practice Providers (APPs -  Physician Assistants and Nurse Practitioners) who all work together to provide you with the care you need, when you need it.  We recommend signing up for the patient portal called "MyChart".  Sign up information is provided on this After Visit Summary.  MyChart is used to connect with patients for Virtual Visits (Telemedicine).  Patients are able to view lab/test results, encounter notes, upcoming appointments, etc.  Non-urgent messages can be sent to your provider as well.   To learn more about what you can do with MyChart, go to [email or web address].    Your next appointment:   5 month(s)  The format for your next appointment:   In Person  Provider:   Robert Krasowski, MD    Other Instructions NA  
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
## 2023-05-25 NOTE — Addendum Note (Signed)
Addended by: Baldo Ash D on: 05/25/2023 02:35 PM   Modules accepted: Orders

## 2023-05-28 ENCOUNTER — Telehealth (HOSPITAL_COMMUNITY): Payer: Self-pay | Admitting: *Deleted

## 2023-05-28 NOTE — Telephone Encounter (Signed)
Left message on voicemail per DPR in reference to upcoming appointment scheduled on 06/02/23 at 8:00 with detailed instructions given per Myocardial Perfusion Study Information Sheet for the test. LM to arrive 15 minutes early, and that it is imperative to arrive on time for appointment to keep from having the test rescheduled. If you need to cancel or reschedule your appointment, please call the office within 24 hours of your appointment. Failure to do so may result in a cancellation of your appointment, and a $50 no show fee. Phone number given for call back for any questions.

## 2023-06-01 ENCOUNTER — Other Ambulatory Visit: Payer: Self-pay | Admitting: Internal Medicine

## 2023-06-02 ENCOUNTER — Ambulatory Visit: Payer: Medicare Other | Attending: Cardiology

## 2023-06-02 DIAGNOSIS — R079 Chest pain, unspecified: Secondary | ICD-10-CM | POA: Diagnosis not present

## 2023-06-02 LAB — MYOCARDIAL PERFUSION IMAGING
LV dias vol: 83 mL (ref 46–106)
LV sys vol: 26 mL
Nuc Stress EF: 69 %
Peak HR: 66 {beats}/min
Rest HR: 57 {beats}/min
Rest Nuclear Isotope Dose: 9.8 mCi
SDS: 0
SRS: 4
SSS: 4
ST Depression (mm): 0 mm
Stress Nuclear Isotope Dose: 28.6 mCi
TID: 1.01

## 2023-06-02 MED ORDER — TECHNETIUM TC 99M TETROFOSMIN IV KIT
28.6000 | PACK | Freq: Once | INTRAVENOUS | Status: AC | PRN
  Administered 2023-06-02: 28.6 via INTRAVENOUS

## 2023-06-02 MED ORDER — TECHNETIUM TC 99M TETROFOSMIN IV KIT
9.8000 | PACK | Freq: Once | INTRAVENOUS | Status: AC | PRN
  Administered 2023-06-02: 9.8 via INTRAVENOUS

## 2023-06-02 MED ORDER — REGADENOSON 0.4 MG/5ML IV SOLN
0.4000 mg | Freq: Once | INTRAVENOUS | Status: AC
Start: 2023-06-02 — End: 2023-06-02
  Administered 2023-06-02: 0.4 mg via INTRAVENOUS

## 2023-06-04 NOTE — Addendum Note (Signed)
Addended by: Gypsy Balsam on: 06/04/2023 10:54 AM   Modules accepted: Orders

## 2023-06-18 ENCOUNTER — Other Ambulatory Visit: Payer: Self-pay | Admitting: Internal Medicine

## 2023-06-30 ENCOUNTER — Ambulatory Visit: Payer: Medicare Other | Admitting: Student

## 2023-06-30 ENCOUNTER — Encounter: Payer: Self-pay | Admitting: Student

## 2023-06-30 VITALS — BP 126/74 | HR 64 | Temp 98.0°F | Resp 18 | Ht 66.5 in | Wt 212.0 lb

## 2023-06-30 DIAGNOSIS — B3731 Acute candidiasis of vulva and vagina: Secondary | ICD-10-CM

## 2023-06-30 HISTORY — DX: Acute candidiasis of vulva and vagina: B37.31

## 2023-06-30 LAB — POCT URINALYSIS DIPSTICK
Bilirubin, UA: NEGATIVE
Blood, UA: NEGATIVE — AB
Glucose, UA: NEGATIVE
Ketones, UA: NEGATIVE
Leukocytes, UA: NEGATIVE
Protein, UA: NEGATIVE
Spec Grav, UA: 1.015 (ref 1.010–1.025)
Urobilinogen, UA: 1 E.U./dL
pH, UA: 7.5 (ref 5.0–8.0)

## 2023-06-30 MED ORDER — FLUCONAZOLE 150 MG PO TABS
150.0000 mg | ORAL_TABLET | Freq: Every day | ORAL | 0 refills | Status: AC
Start: 2023-06-30 — End: 2023-07-30

## 2023-06-30 NOTE — Progress Notes (Signed)
Acute Office Visit  Subjective:     Patient ID: Marie [name], female    DOB: 02/03/1941, 82 y.o.   MRN: [id number]  Chief Complaint  Patient presents with   Office visit    Bumps on private area.    HPI Patient is in today for irritation around her vagina. Yesterday morning she noticed 2 bumps with a white head, she wiped and tried to remove it but it wouldn't come off.  She says that the area burns.  She denies discharge, bleeding, or itching. History of DM type II, she skipped two doses of empagliflozin thinking it could be the cause.       Review of Systems  Constitutional:  Negative for chills and fever.  Respiratory: Negative.    Cardiovascular: Negative.   Gastrointestinal: Negative.   Genitourinary: Negative.   Neurological: Negative.         Objective:    BP 126/74 (BP Location: Left Arm, Patient Position: Sitting, Cuff Size: Normal)   Pulse 64   Temp 98 F (36.7 C)   Resp 18   Ht 5' 6.5" (1.689 m)   Wt 212 lb (96.2 kg)   SpO2 97%   BMI 33.71 kg/m    Physical Exam Exam conducted with a chaperone present.  Cardiovascular:     Rate and Rhythm: Normal rate and regular rhythm.     Pulses: Normal pulses.     Heart sounds: Normal heart sounds.  Pulmonary:     Effort: Pulmonary effort is normal.     Breath sounds: Normal breath sounds.  Abdominal:     General: Abdomen is flat. Bowel sounds are normal.     Palpations: Abdomen is soft.  Genitourinary:    Exam position: Lithotomy position.     Pubic Area: No rash.      Labia:        Right: No lesion.        Left: No lesion.      Urethra: No prolapse or urethral pain.     Vagina: Vaginal discharge present.     Comments: White discharge noted, no blisters present. Skin:    General: Skin is warm and dry.     Capillary Refill: Capillary refill takes less than 2 seconds.  Psychiatric:        Mood and Affect: Mood normal.        Behavior: Behavior normal.     Results for orders placed or performed  in visit on 06/30/23  POCT urinalysis dipstick  Result Value Ref Range   Color, UA     Clarity, UA     Glucose, UA Negative Negative   Bilirubin, UA Negative    Ketones, UA Negative    Spec Grav, UA 1.015 1.010 - 1.025   Blood, UA Negative (VS)    pH, UA 7.5 5.0 - 8.0   Protein, UA Negative Negative   Urobilinogen, UA 1.0 0.2 or 1.0 E.U./dL                      .     Leukocytes, UA Negative Negative   Appearance                        .          Assessment & Plan:   Problem List Items Addressed This Visit     Vulvovaginal candidiasis - Primary    The point-of-care urinalysis was negative for  bacterial infection.  I will treat her with Diflucan 1 dose for yeast.  I encouraged the patient to drink plenty of water and cranberry juice.  I also suggest wipes specific for vaginal yeast.      Relevant Medications   fluconazole (DIFLUCAN) 150 MG tablet   Other Relevant Orders   POCT urinalysis dipstick (Completed)    Meds ordered this encounter  Medications   fluconazole (DIFLUCAN) 150 MG tablet    Sig: Take 1 tablet (150 mg total) by mouth daily.    Dispense:  1 tablet    Refill:  0    No follow-ups on file.  Edwena Blow, NP

## 2023-06-30 NOTE — Assessment & Plan Note (Signed)
The point-of-care urinalysis was negative for bacterial infection.  I will treat her with Diflucan 1 dose for yeast.  I encouraged the patient to drink plenty of water and cranberry juice.  I also suggest wipes specific for vaginal yeast.

## 2023-07-02 ENCOUNTER — Encounter: Payer: Self-pay | Admitting: Internal Medicine

## 2023-07-08 ENCOUNTER — Other Ambulatory Visit: Payer: Self-pay | Admitting: Internal Medicine

## 2023-07-27 ENCOUNTER — Ambulatory Visit: Payer: Medicare Other | Admitting: Internal Medicine

## 2023-07-27 ENCOUNTER — Encounter: Payer: Self-pay | Admitting: Internal Medicine

## 2023-07-27 VITALS — BP 126/82 | HR 64 | Temp 97.7°F | Resp 16 | Ht 66.0 in | Wt 213.0 lb

## 2023-07-27 DIAGNOSIS — I10 Essential (primary) hypertension: Secondary | ICD-10-CM | POA: Diagnosis not present

## 2023-07-27 DIAGNOSIS — G894 Chronic pain syndrome: Secondary | ICD-10-CM

## 2023-07-27 DIAGNOSIS — E1121 Type 2 diabetes mellitus with diabetic nephropathy: Secondary | ICD-10-CM | POA: Diagnosis not present

## 2023-07-27 MED ORDER — TRAMADOL HCL 50 MG PO TABS: 50.0000 mg | ORAL_TABLET | Freq: Two times a day (BID) | ORAL | 2 refills | Status: DC | PRN

## 2023-07-27 NOTE — Assessment & Plan Note (Signed)
I reviewed her [place] controlled substance registry.  We will refill her tramadol at this time.
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
## 2023-07-27 NOTE — Assessment & Plan Note (Signed)
Her BP is controlled and we will continue to monitor and continue on her current meds.

## 2023-07-27 NOTE — Assessment & Plan Note (Signed)
We will check her HgBA1c today and adjust her ozempic as needed.

## 2023-07-28 LAB — HEMOGLOBIN A1C
Est. average glucose Bld gHb Est-mCnc: 151 mg/dL
Hgb A1c MFr Bld: 6.9 % — ABNORMAL HIGH (ref 4.8–5.6)

## 2023-08-04 NOTE — Progress Notes (Signed)
Her diabetes is now controlled. Continue on his meds.  Spoke with patient and made aware of labs.

## 2023-08-24 ENCOUNTER — Other Ambulatory Visit: Payer: Self-pay | Admitting: Internal Medicine

## 2023-08-26 ENCOUNTER — Other Ambulatory Visit: Payer: Self-pay | Admitting: Internal Medicine

## 2023-09-03 ENCOUNTER — Encounter: Payer: Self-pay | Admitting: Internal Medicine

## 2023-09-07 ENCOUNTER — Other Ambulatory Visit: Payer: Self-pay | Admitting: Internal Medicine

## 2023-09-14 ENCOUNTER — Other Ambulatory Visit: Payer: Self-pay | Admitting: Internal Medicine

## 2023-09-15 ENCOUNTER — Encounter: Payer: Self-pay | Admitting: Internal Medicine

## 2023-10-07 DIAGNOSIS — N183 Chronic kidney disease, stage 3 unspecified: Secondary | ICD-10-CM | POA: Insufficient documentation

## 2023-10-08 ENCOUNTER — Encounter: Payer: Self-pay | Admitting: Cardiology

## 2023-10-08 ENCOUNTER — Ambulatory Visit: Payer: Medicare Other | Attending: Cardiology | Admitting: Cardiology

## 2023-10-08 ENCOUNTER — Other Ambulatory Visit: Payer: Self-pay | Admitting: Internal Medicine

## 2023-10-08 VITALS — BP 114/68 | HR 65 | Ht 66.5 in | Wt 205.8 lb

## 2023-10-08 DIAGNOSIS — I2510 Atherosclerotic heart disease of native coronary artery without angina pectoris: Secondary | ICD-10-CM | POA: Diagnosis not present

## 2023-10-08 DIAGNOSIS — I10 Essential (primary) hypertension: Secondary | ICD-10-CM | POA: Diagnosis not present

## 2023-10-08 DIAGNOSIS — N183 Chronic kidney disease, stage 3 unspecified: Secondary | ICD-10-CM

## 2023-10-08 DIAGNOSIS — E782 Mixed hyperlipidemia: Secondary | ICD-10-CM

## 2023-10-08 DIAGNOSIS — E669 Obesity, unspecified: Secondary | ICD-10-CM | POA: Diagnosis not present

## 2023-10-08 DIAGNOSIS — I480 Paroxysmal atrial fibrillation: Secondary | ICD-10-CM | POA: Diagnosis not present

## 2023-10-08 NOTE — Patient Instructions (Signed)
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
## 2023-10-15 ENCOUNTER — Other Ambulatory Visit: Payer: Self-pay | Admitting: Internal Medicine

## 2023-11-17 ENCOUNTER — Other Ambulatory Visit: Payer: Self-pay | Admitting: Cardiology

## 2023-11-24 ENCOUNTER — Other Ambulatory Visit: Payer: Self-pay

## 2023-11-24 ENCOUNTER — Other Ambulatory Visit: Payer: Self-pay | Admitting: Internal Medicine

## 2023-11-24 MED ORDER — PANTOPRAZOLE SODIUM 40 MG PO TBEC: 40.0000 mg | DELAYED_RELEASE_TABLET | Freq: Every day | ORAL | 2 refills | Status: DC

## 2023-11-24 NOTE — Progress Notes (Signed)
Rx Refill

## 2023-11-27 ENCOUNTER — Encounter: Payer: Self-pay | Admitting: Internal Medicine

## 2023-11-27 ENCOUNTER — Ambulatory Visit: Payer: Medicare Other | Admitting: Internal Medicine

## 2023-11-27 VITALS — BP 126/82 | HR 57 | Temp 98.4°F | Resp 17 | Ht 66.0 in | Wt 208.8 lb

## 2023-11-27 DIAGNOSIS — E1121 Type 2 diabetes mellitus with diabetic nephropathy: Secondary | ICD-10-CM

## 2023-11-27 DIAGNOSIS — I10 Essential (primary) hypertension: Secondary | ICD-10-CM

## 2023-11-27 DIAGNOSIS — E7800 Pure hypercholesterolemia, unspecified: Secondary | ICD-10-CM

## 2023-11-27 DIAGNOSIS — E6609 Other obesity due to excess calories: Secondary | ICD-10-CM

## 2023-11-27 DIAGNOSIS — E66811 Obesity, class 1: Secondary | ICD-10-CM

## 2023-11-27 DIAGNOSIS — Z6833 Body mass index (BMI) 33.0-33.9, adult: Secondary | ICD-10-CM | POA: Insufficient documentation

## 2023-11-27 DIAGNOSIS — M8580 Other specified disorders of bone density and structure, unspecified site: Secondary | ICD-10-CM

## 2023-11-27 DIAGNOSIS — K219 Gastro-esophageal reflux disease without esophagitis: Secondary | ICD-10-CM

## 2023-11-27 DIAGNOSIS — Z23 Encounter for immunization: Secondary | ICD-10-CM | POA: Diagnosis not present

## 2023-11-27 DIAGNOSIS — I480 Paroxysmal atrial fibrillation: Secondary | ICD-10-CM | POA: Diagnosis not present

## 2023-11-27 DIAGNOSIS — Z1231 Encounter for screening mammogram for malignant neoplasm of breast: Secondary | ICD-10-CM

## 2023-11-27 DIAGNOSIS — G894 Chronic pain syndrome: Secondary | ICD-10-CM | POA: Diagnosis not present

## 2023-11-27 DIAGNOSIS — I2510 Atherosclerotic heart disease of native coronary artery without angina pectoris: Secondary | ICD-10-CM | POA: Diagnosis not present

## 2023-11-27 DIAGNOSIS — M150 Primary generalized (osteo)arthritis: Secondary | ICD-10-CM

## 2023-11-27 DIAGNOSIS — N1831 Chronic kidney disease, stage 3a: Secondary | ICD-10-CM

## 2023-11-27 DIAGNOSIS — Z0000 Encounter for general adult medical examination without abnormal findings: Secondary | ICD-10-CM

## 2023-11-27 DIAGNOSIS — E669 Obesity, unspecified: Secondary | ICD-10-CM

## 2023-11-27 HISTORY — DX: Body mass index (BMI) 33.0-33.9, adult: Z68.33

## 2023-11-27 HISTORY — DX: Other obesity due to excess calories: E66.09

## 2023-11-27 HISTORY — DX: Primary generalized (osteo)arthritis: M15.0

## 2023-11-27 HISTORY — DX: Other specified disorders of bone density and structure, unspecified site: M85.80

## 2023-11-27 HISTORY — DX: Pure hypercholesterolemia, unspecified: E78.00

## 2023-11-27 MED ORDER — TRAMADOL HCL 50 MG PO TABS: 50.0000 mg | ORAL_TABLET | Freq: Two times a day (BID) | ORAL | 2 refills | Status: DC | PRN

## 2023-11-27 MED ORDER — OZEMPIC (0.25 OR 0.5 MG/DOSE) 2 MG/3ML ~~LOC~~ SOPN: 0.2500 mg | PEN_INJECTOR | SUBCUTANEOUS | 5 refills | Status: DC

## 2023-11-27 NOTE — Assessment & Plan Note (Signed)
I want her to eat healthy, exercise and lose weight. 

## 2023-11-27 NOTE — Assessment & Plan Note (Signed)
We have tried her on a number of medications including farxiga, jardiance and [place].  We have her on ozempic at this time.  She is to avoid NSAIDS.

## 2023-11-27 NOTE — Assessment & Plan Note (Signed)
Continue tramadol and she can use tylenol as needed.

## 2023-11-27 NOTE — Progress Notes (Signed)
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     Findings: No rash.  Neurological:     General: No focal deficit present.     Mental Status: She is alert and oriented to person, place, and time.     Comments: CN II-XII grossly intact  Psychiatric:        Mood and Affect: Mood normal.        Behavior: Behavior normal.      Assessment:      Diabetic nephropathy associated with type 2 diabetes mellitus (HCC)  Stage 3a chronic kidney disease (HCC)  Chronic pain syndrome  Essential hypertension  Hypercholesterolemia  Primary osteoarthritis involving multiple joints  Coronary artery disease involving native coronary artery of native heart without angina pectoris  Paroxysmal atrial fibrillation (HCC)  Osteopenia, unspecified location  BMI 33.0-33.9,adult  Class 1 obesity due to excess calories with serious comorbidity and body mass index (BMI) of 33.0 to 33.9 in adult  Gastroesophageal reflux disease, unspecified whether esophagitis present  PAF (paroxysmal atrial fibrillation) (HCC)  Obesity (BMI 30-39.9)  Screening mammogram for breast cancer    Plan:     During the course of the visit the patient was educated and counseled about appropriate  screening and preventive services including:   Pneumococcal vaccine  Flu vaccine Advanced directives discussed  Diet review for nutrition referral? Yes ____  Not Indicated ___X_   Patient Instructions (the written plan) was given to the patient.  PAF (paroxysmal atrial fibrillation) (HCC) Her A. Fib is currently rhythm controlled and she remains on xarelto without any problems.  Her notes from cardiology were reviewed.  Essential hypertension Her BP is currently controlled.  We will continue her current meds.  Coronary artery disease involving native coronary artery of native heart without angina pectoris She has SOB but this is not due to her heart.  She has CAD that is stable.  We will continue risk factor modficiation and she is on xarelto.  Gastroesophageal reflux disease Continue protonix at this time.  Diabetic nephropathy associated with type 2 diabetes mellitus (HCC) Our goal HgBA1c <7%.  She had some hypoglycemia so I asked her to stop her glipizide ER and continue actos and ozempic.  Her diabetic foot exam was  normal.  We will obtain some diabetic labs.  Primary osteoarthritis involving multiple joints Continue tramadol and she can use tylenol as needed.  Osteopenia She previously had osteoporosis but now has osteopenia where she has completed a reclast series in the past.  Contnue with weight bearing exercise and calcium and Vit D.  Stage 3a chronic kidney disease (HCC) We have tried her on a number of medications including farxiga, jardiance and [place].  We have her on ozempic at this time.  She is to avoid NSAIDS.  Obesity (BMI 30-39.9) I want her to eat healthy, exercise and lose weight.  Hypercholesterolemia We will check her FLP.  She is to remain on her statin.  Chronic pain syndrome We will continue her current tramadol. Her [place] controlled substance registry was reviewed.  BMI 33.0-33.9,adult Plan as above.   Prevention Health maintenance discussed.   We will arrange for mammogram.  We will obtain some yearly labs.   Medicare Attestation I have personally reviewed: The patient's medical and social history Their use of alcohol, tobacco or illicit drugs Their current medications and supplements The patient's functional ability including ADLs,fall risks, home safety risks, cognitive, and hearing and visual impairment Diet and physical activities Evidence for depression or mood disorders  The patient's weight, height, and BMI have been recorded in the chart.  I have made referrals, counseling, and provided education to the patient based on review of the above and I have provided the patient with a written personalized care plan for preventive services.     Crist Fat, MD   11/27/2023

## 2023-11-27 NOTE — Assessment & Plan Note (Signed)
Plan as above.  

## 2023-11-27 NOTE — Assessment & Plan Note (Signed)
 Her BP is currently controlled.  We will continue her current meds.

## 2023-11-27 NOTE — Assessment & Plan Note (Signed)
Her A. Fib is currently rhythm controlled and she remains on xarelto without any problems.  Her notes from cardiology were reviewed.

## 2023-11-27 NOTE — Assessment & Plan Note (Signed)
Continue protonix at this time.

## 2023-11-27 NOTE — Assessment & Plan Note (Signed)
She has SOB but this is not due to her heart.  She has CAD that is stable.  We will continue risk factor modficiation and she is on xarelto.

## 2023-11-27 NOTE — Assessment & Plan Note (Signed)
She previously had osteoporosis but now has osteopenia where she has completed a reclast series in the past.  Contnue with weight bearing exercise and calcium and Vit D.

## 2023-11-27 NOTE — Assessment & Plan Note (Signed)
Our goal HgBA1c <7%.  She had some hypoglycemia so I asked her to stop her glipizide ER and continue actos and ozempic.  Her diabetic foot exam was normal.  We will obtain some diabetic labs.

## 2023-11-27 NOTE — Assessment & Plan Note (Signed)
We will check her FLP.  She is to remain on her statin.

## 2023-11-27 NOTE — Assessment & Plan Note (Signed)
We will continue her current tramadol. Her [place] controlled substance registry was reviewed.

## 2023-11-29 LAB — TSH: TSH: 1.7 u[IU]/mL (ref 0.450–4.500)

## 2023-11-29 LAB — CBC WITH DIFFERENTIAL/PLATELET
Basophils Absolute: 0.1 10*3/uL (ref 0.0–0.2)
Basos: 1 %
EOS (ABSOLUTE): 0.1 10*3/uL (ref 0.0–0.4)
Eos: 2 %
Hematocrit: 41.3 % (ref 34.0–46.6)
Hemoglobin: 13.7 g/dL (ref 11.1–15.9)
Immature Grans (Abs): 0 10*3/uL (ref 0.0–0.1)
Immature Granulocytes: 0 %
Lymphocytes Absolute: 1.6 10*3/uL (ref 0.7–3.1)
Lymphs: 24 %
MCH: 31.5 pg (ref 26.6–33.0)
MCHC: 33.2 g/dL (ref 31.5–35.7)
MCV: 95 fL (ref 79–97)
Monocytes Absolute: 0.7 10*3/uL (ref 0.1–0.9)
Monocytes: 10 %
Neutrophils Absolute: 4.2 10*3/uL (ref 1.4–7.0)
Neutrophils: 63 %
Platelets: 348 10*3/uL (ref 150–450)
RBC: 4.35 x10E6/uL (ref 3.77–5.28)
RDW: 13.2 % (ref 11.7–15.4)
WBC: 6.7 10*3/uL (ref 3.4–10.8)

## 2023-11-29 LAB — CMP14 + ANION GAP
ALT: 18 [IU]/L (ref 0–32)
AST: 21 [IU]/L (ref 0–40)
Albumin: 4.3 g/dL (ref 3.8–4.8)
Alkaline Phosphatase: 60 [IU]/L (ref 44–121)
Anion Gap: 15 mmol/L (ref 10.0–18.0)
BUN/Creatinine Ratio: 23 (ref 12–28)
BUN: 24 mg/dL (ref 8–27)
Bilirubin Total: 0.6 mg/dL (ref 0.0–1.2)
CO2: 29 mmol/L (ref 20–29)
Calcium: 10.2 mg/dL (ref 8.7–10.3)
Chloride: 86 mmol/L — ABNORMAL LOW (ref 96–106)
Creatinine, Ser: 1.03 mg/dL — ABNORMAL HIGH (ref 0.57–1.00)
Globulin, Total: 2.7 g/dL (ref 1.5–4.5)
Glucose: 163 mg/dL — ABNORMAL HIGH (ref 70–99)
Potassium: 4.1 mmol/L (ref 3.5–5.2)
Sodium: 130 mmol/L — ABNORMAL LOW (ref 134–144)
Total Protein: 7 g/dL (ref 6.0–8.5)
eGFR: 55 mL/min/{1.73_m2} — ABNORMAL LOW (ref >59)

## 2023-11-29 LAB — HEMOGLOBIN A1C
Est. average glucose Bld gHb Est-mCnc: 146 mg/dL
Hgb A1c MFr Bld: 6.7 % — ABNORMAL HIGH (ref 4.8–5.6)

## 2023-11-29 LAB — LIPID PANEL
Chol/HDL Ratio: 3.2 {ratio} (ref 0.0–4.4)
Cholesterol, Total: 162 mg/dL (ref 100–199)
HDL: 51 mg/dL (ref >39)
LDL Chol Calc (NIH): 82 mg/dL (ref 0–99)
Triglycerides: 172 mg/dL — ABNORMAL HIGH (ref 0–149)
VLDL Cholesterol Cal: 29 mg/dL (ref 5–40)

## 2023-11-29 LAB — MICROALBUMIN / CREATININE URINE RATIO
Creatinine, Urine: 66.8 mg/dL
Microalb/Creat Ratio: 7 mg/g{creat} (ref 0–29)
Microalbumin, Urine: 4.8 ug/mL

## 2023-12-24 NOTE — Progress Notes (Signed)
Her TG level is a bit elevated- watch fats in diet. Her other labs look good.  Patient aware

## 2024-01-19 DIAGNOSIS — Z1231 Encounter for screening mammogram for malignant neoplasm of breast: Secondary | ICD-10-CM | POA: Diagnosis not present

## 2024-01-20 DIAGNOSIS — Z1231 Encounter for screening mammogram for malignant neoplasm of breast: Secondary | ICD-10-CM | POA: Diagnosis not present

## 2024-01-26 ENCOUNTER — Encounter: Payer: Self-pay | Admitting: Internal Medicine

## 2024-02-09 ENCOUNTER — Other Ambulatory Visit: Payer: Self-pay | Admitting: Internal Medicine

## 2024-02-24 ENCOUNTER — Encounter: Payer: Self-pay | Admitting: Internal Medicine

## 2024-02-24 ENCOUNTER — Ambulatory Visit: Payer: Medicare Other | Admitting: Internal Medicine

## 2024-02-24 VITALS — BP 130/80 | HR 62 | Temp 98.7°F | Resp 18 | Ht 66.0 in | Wt 202.4 lb

## 2024-02-24 DIAGNOSIS — I10 Essential (primary) hypertension: Secondary | ICD-10-CM

## 2024-02-24 DIAGNOSIS — E7800 Pure hypercholesterolemia, unspecified: Secondary | ICD-10-CM

## 2024-02-24 DIAGNOSIS — E1121 Type 2 diabetes mellitus with diabetic nephropathy: Secondary | ICD-10-CM | POA: Diagnosis not present

## 2024-02-24 DIAGNOSIS — G894 Chronic pain syndrome: Secondary | ICD-10-CM | POA: Diagnosis not present

## 2024-02-24 DIAGNOSIS — R2689 Other abnormalities of gait and mobility: Secondary | ICD-10-CM

## 2024-02-24 DIAGNOSIS — R531 Weakness: Secondary | ICD-10-CM | POA: Insufficient documentation

## 2024-02-24 DIAGNOSIS — N1831 Chronic kidney disease, stage 3a: Secondary | ICD-10-CM

## 2024-02-24 HISTORY — DX: Weakness: R53.1

## 2024-02-24 HISTORY — DX: Other abnormalities of gait and mobility: R26.89

## 2024-02-24 MED ORDER — TRAMADOL HCL 50 MG PO TABS: 50.0000 mg | ORAL_TABLET | Freq: Two times a day (BID) | ORAL | 2 refills | Status: DC | PRN

## 2024-02-24 NOTE — Assessment & Plan Note (Signed)
 She is now off glipizide ER and denies any further problems with hypoglycemia.  I want to obtain a HgBa1c on her today.

## 2024-02-24 NOTE — Assessment & Plan Note (Signed)
 Plan as above.

## 2024-02-24 NOTE — Assessment & Plan Note (Signed)
 Her BP is controlled and we will continue her current medications.

## 2024-02-24 NOTE — Assessment & Plan Note (Signed)
 She is having some new weakness and imbalance.  She has a shuffling gait when I see her today.  She has to hold on to her husband at times to walk.  I am going to refer her for outpatient PT.  I have reviewed her [place] CSR/PDMP and we will refill her tramadol.
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
## 2024-02-24 NOTE — Assessment & Plan Note (Signed)
 Her kidney function on last check has remained stable.  She is not on an ACE-I and we tried getting her on [place] but again, this was too expensive.  We will continue control of her diabetes and HTN.  She is to avoid all NSAIDS.

## 2024-02-24 NOTE — Assessment & Plan Note (Signed)
 Her goal LDL <50 with her history of diabetes and CAD.

## 2024-02-25 LAB — CMP14 + ANION GAP
ALT: 17 IU/L (ref 0–32)
AST: 19 IU/L (ref 0–40)
Albumin: 4.3 g/dL (ref 3.8–4.8)
Alkaline Phosphatase: 74 IU/L (ref 44–121)
Anion Gap: 17 mmol/L (ref 10.0–18.0)
BUN/Creatinine Ratio: 17 (ref 12–28)
BUN: 17 mg/dL (ref 8–27)
Bilirubin Total: 0.9 mg/dL (ref 0.0–1.2)
CO2: 28 mmol/L (ref 20–29)
Calcium: 9.9 mg/dL (ref 8.7–10.3)
Chloride: 85 mmol/L — ABNORMAL LOW (ref 96–106)
Creatinine, Ser: 0.99 mg/dL (ref 0.57–1.00)
Globulin, Total: 2.5 g/dL (ref 1.5–4.5)
Glucose: 233 mg/dL — ABNORMAL HIGH (ref 70–99)
Potassium: 3.9 mmol/L (ref 3.5–5.2)
Sodium: 130 mmol/L — ABNORMAL LOW (ref 134–144)
Total Protein: 6.8 g/dL (ref 6.0–8.5)
eGFR: 58 mL/min/{1.73_m2} — ABNORMAL LOW (ref >59)

## 2024-02-25 LAB — LIPID PANEL
Chol/HDL Ratio: 3.2 ratio (ref 0.0–4.4)
Cholesterol, Total: 152 mg/dL (ref 100–199)
HDL: 47 mg/dL (ref >39)
LDL Chol Calc (NIH): 76 mg/dL (ref 0–99)
Triglycerides: 174 mg/dL — ABNORMAL HIGH (ref 0–149)
VLDL Cholesterol Cal: 29 mg/dL (ref 5–40)

## 2024-02-25 LAB — HEMOGLOBIN A1C
Est. average glucose Bld gHb Est-mCnc: 189 mg/dL
Hgb A1c MFr Bld: 8.2 % — ABNORMAL HIGH (ref 4.8–5.6)

## 2024-03-02 ENCOUNTER — Ambulatory Visit: Admitting: Internal Medicine

## 2024-03-02 ENCOUNTER — Encounter: Payer: Self-pay | Admitting: Internal Medicine

## 2024-03-02 VITALS — BP 138/80 | HR 61 | Temp 98.6°F | Resp 18 | Wt 205.3 lb

## 2024-03-02 DIAGNOSIS — L89899 Pressure ulcer of other site, unspecified stage: Secondary | ICD-10-CM | POA: Insufficient documentation

## 2024-03-02 HISTORY — DX: Pressure ulcer of other site, unspecified stage: L89.899

## 2024-03-02 MED ORDER — OZEMPIC (1 MG/DOSE) 2 MG/1.5ML ~~LOC~~ SOPN: 1.0000 mg | PEN_INJECTOR | SUBCUTANEOUS | 3 refills | Status: DC

## 2024-03-02 MED ORDER — ATORVASTATIN CALCIUM 80 MG PO TABS: 80.0000 mg | ORAL_TABLET | Freq: Every day | ORAL | 2 refills | Status: DC

## 2024-03-02 MED ORDER — OZEMPIC (0.25 OR 0.5 MG/DOSE) 2 MG/1.5ML ~~LOC~~ SOPN: 0.5000 mg | PEN_INJECTOR | SUBCUTANEOUS | 0 refills | Status: DC

## 2024-03-02 NOTE — Assessment & Plan Note (Addendum)
 This is not an infection and she actually has a pressure ulcer to the lateral side of her right big toe.  This area corresponds to the bony prominence of the joint of her right 2nd toe pressing against the big toe.  She has been putting neosporin on a cotton swab.  The ulcer is macerated and I asked her to stop putting neosporin on a cotton swab and keep these toes dry.  It does not look infected and this is not an abscess.  She does have a history of diabetic nephropathy and neuropathy.  I am going to refer her to podiatry.  Her A1c is a bit worse when I saw her last week and I am going to go up on her dose of ozempic as well as her dose of atorvastatin due to her history of diabetes and CAD.
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
## 2024-03-07 ENCOUNTER — Ambulatory Visit (INDEPENDENT_AMBULATORY_CARE_PROVIDER_SITE_OTHER)

## 2024-03-07 ENCOUNTER — Encounter: Payer: Self-pay | Admitting: Podiatry

## 2024-03-07 ENCOUNTER — Ambulatory Visit: Admitting: Podiatry

## 2024-03-07 DIAGNOSIS — M2041 Other hammer toe(s) (acquired), right foot: Secondary | ICD-10-CM

## 2024-03-07 DIAGNOSIS — M7751 Other enthesopathy of right foot: Secondary | ICD-10-CM | POA: Diagnosis not present

## 2024-03-07 DIAGNOSIS — E1121 Type 2 diabetes mellitus with diabetic nephropathy: Secondary | ICD-10-CM | POA: Diagnosis not present

## 2024-03-07 DIAGNOSIS — M778 Other enthesopathies, not elsewhere classified: Secondary | ICD-10-CM

## 2024-03-07 DIAGNOSIS — M21619 Bunion of unspecified foot: Secondary | ICD-10-CM

## 2024-03-07 DIAGNOSIS — L97512 Non-pressure chronic ulcer of other part of right foot with fat layer exposed: Secondary | ICD-10-CM | POA: Diagnosis not present

## 2024-03-07 MED ORDER — MUPIROCIN 2 % EX OINT: 1.0000 | TOPICAL_OINTMENT | Freq: Two times a day (BID) | CUTANEOUS | 0 refills | Status: DC
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
## 2024-03-15 DIAGNOSIS — R2681 Unsteadiness on feet: Secondary | ICD-10-CM | POA: Diagnosis not present

## 2024-03-15 DIAGNOSIS — R531 Weakness: Secondary | ICD-10-CM | POA: Diagnosis not present

## 2024-03-16 ENCOUNTER — Other Ambulatory Visit: Payer: Self-pay | Admitting: Internal Medicine

## 2024-03-18 ENCOUNTER — Ambulatory Visit: Admitting: Internal Medicine

## 2024-03-18 ENCOUNTER — Encounter: Payer: Self-pay | Admitting: Internal Medicine

## 2024-03-18 VITALS — BP 124/82 | HR 71 | Temp 98.4°F | Resp 17 | Ht 66.0 in | Wt 204.8 lb

## 2024-03-18 DIAGNOSIS — R1013 Epigastric pain: Secondary | ICD-10-CM

## 2024-03-18 HISTORY — DX: Epigastric pain: R10.13

## 2024-03-18 LAB — POCT URINALYSIS DIPSTICK
Appearance: NORMAL
Bilirubin, UA: NEGATIVE
Glucose, UA: POSITIVE — AB
Ketones, UA: NEGATIVE
Leukocytes, UA: NEGATIVE
Nitrite, UA: NEGATIVE
Protein, UA: NEGATIVE
Spec Grav, UA: 1.015 (ref 1.010–1.025)
Urobilinogen, UA: 1 U/dL
pH, UA: 7.5 (ref 5.0–8.0)

## 2024-03-18 MED ORDER — PANTOPRAZOLE SODIUM 40 MG PO TBEC: 40.0000 mg | DELAYED_RELEASE_TABLET | Freq: Two times a day (BID) | ORAL | 0 refills | Status: DC
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
## 2024-03-18 NOTE — Addendum Note (Signed)
 Addended by: Mickeal Skinner on: 03/18/2024 02:36 PM   Modules accepted: Orders

## 2024-03-18 NOTE — Assessment & Plan Note (Signed)
 I have asked the patient to increase her protonix from 40mg  daily to 40mg  BID.  We will obtain some labs including a UA, CBC, CMP, amylase and lipase.  She may need a RUQ [place].  We will refer her back to GI as well as she may need endoscopy.  Further testing depends on labs ordered.  If her pain worsens, she is to go to the ER.

## 2024-03-18 NOTE — Addendum Note (Signed)
 Addended by: Darci Needle on: 03/18/2024 03:08 PM   Modules accepted: Orders

## 2024-03-19 LAB — CBC WITH DIFFERENTIAL/PLATELET
Basophils Absolute: 0.1 10*3/uL (ref 0.0–0.2)
Basos: 1 %
EOS (ABSOLUTE): 0.1 10*3/uL (ref 0.0–0.4)
Eos: 2 %
Hematocrit: 40.7 % (ref 34.0–46.6)
Hemoglobin: 13.2 g/dL (ref 11.1–15.9)
Immature Grans (Abs): 0 10*3/uL (ref 0.0–0.1)
Immature Granulocytes: 0 %
Lymphocytes Absolute: 1.3 10*3/uL (ref 0.7–3.1)
Lymphs: 24 %
MCH: 31.3 pg (ref 26.6–33.0)
MCHC: 32.4 g/dL (ref 31.5–35.7)
MCV: 96 fL (ref 79–97)
Monocytes Absolute: 0.5 10*3/uL (ref 0.1–0.9)
Monocytes: 8 %
Neutrophils Absolute: 3.7 10*3/uL (ref 1.4–7.0)
Neutrophils: 65 %
Platelets: 356 10*3/uL (ref 150–450)
RBC: 4.22 x10E6/uL (ref 3.77–5.28)
RDW: 12.1 % (ref 11.7–15.4)
WBC: 5.7 10*3/uL (ref 3.4–10.8)

## 2024-03-19 LAB — CMP14 + ANION GAP
ALT: 18 IU/L (ref 0–32)
AST: 22 IU/L (ref 0–40)
Albumin: 4.4 g/dL (ref 3.8–4.8)
Alkaline Phosphatase: 63 IU/L (ref 44–121)
Anion Gap: 14 mmol/L (ref 10.0–18.0)
BUN/Creatinine Ratio: 16 (ref 12–28)
BUN: 15 mg/dL (ref 8–27)
Bilirubin Total: 0.9 mg/dL (ref 0.0–1.2)
CO2: 28 mmol/L (ref 20–29)
Calcium: 9.1 mg/dL (ref 8.7–10.3)
Chloride: 84 mmol/L — ABNORMAL LOW (ref 96–106)
Creatinine, Ser: 0.92 mg/dL (ref 0.57–1.00)
Globulin, Total: 2.2 g/dL (ref 1.5–4.5)
Glucose: 231 mg/dL — ABNORMAL HIGH (ref 70–99)
Potassium: 3.6 mmol/L (ref 3.5–5.2)
Sodium: 126 mmol/L — ABNORMAL LOW (ref 134–144)
Total Protein: 6.6 g/dL (ref 6.0–8.5)
eGFR: 63 mL/min/{1.73_m2} (ref >59)

## 2024-03-19 LAB — LIPASE: Lipase: 43 U/L (ref 14–85)

## 2024-03-19 LAB — AMYLASE: Amylase: 89 U/L (ref 31–110)

## 2024-03-20 LAB — URINE CULTURE

## 2024-03-22 ENCOUNTER — Ambulatory Visit: Admitting: Podiatry

## 2024-03-22 DIAGNOSIS — E1140 Type 2 diabetes mellitus with diabetic neuropathy, unspecified: Secondary | ICD-10-CM | POA: Diagnosis not present

## 2024-03-22 DIAGNOSIS — L97512 Non-pressure chronic ulcer of other part of right foot with fat layer exposed: Secondary | ICD-10-CM | POA: Diagnosis not present

## 2024-03-22 DIAGNOSIS — M21619 Bunion of unspecified foot: Secondary | ICD-10-CM | POA: Diagnosis not present

## 2024-03-22 NOTE — Patient Instructions (Signed)
 More silicone pads can be purchased from:  [email or web address]  Look for double looped toe spacers.  These can also be found on [employer].
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
## 2024-03-23 NOTE — Progress Notes (Signed)
 The patient's labs and urine look good. Please arrnage for a RUQ US - call tera. Please make sure she has an appointment with GI.  Patient is aware. Pt's appointment is April 28th with GI

## 2024-03-27 ENCOUNTER — Encounter: Payer: Self-pay | Admitting: Podiatry

## 2024-03-28 ENCOUNTER — Other Ambulatory Visit: Payer: Self-pay | Admitting: Internal Medicine

## 2024-03-31 DIAGNOSIS — M25551 Pain in right hip: Secondary | ICD-10-CM | POA: Diagnosis not present

## 2024-04-04 DIAGNOSIS — R1013 Epigastric pain: Secondary | ICD-10-CM | POA: Diagnosis not present

## 2024-04-05 ENCOUNTER — Telehealth: Payer: Self-pay

## 2024-04-05 NOTE — Telephone Encounter (Signed)
   Pre-operative Risk Assessment    Patient Name: Marie [name]  DOB: 1941-07-29 MRN: [id number]   Date of last office visit: 10/08/23 Date of next office visit: N/A   Request for Surgical Clearance    Procedure:   endoscopy  Date of Surgery:  Clearance 04/19/24                                Surgeon:  Dr. Dellis Fermo Surgeon's Group or Practice Name:  [facility] Phone number:  [phone] Fax number:  [phone]   Type of Clearance Requested:   - Pharmacy:  Hold Rivaroxaban (Xarelto) 2 days   Type of Anesthesia:  Not Indicated   Additional requests/questions:    Gib Kurk   04/05/2024, 6:57 AM

## 2024-04-05 NOTE — Telephone Encounter (Signed)
 Patient with diagnosis of afib on Xarelto for anticoagulation.    Procedure:  endoscopy  Date of procedure: 04/19/24   CHA2DS2-VASc Score = 6   This indicates a 9.7% annual risk of stroke. The patient's score is based upon: CHF History: 1 HTN History: 1 Diabetes History: 1 Stroke History: 0 Vascular Disease History: 0 Age Score: 2 Gender Score: 1      CrCl 55 ml/min Platelet count 356  Patient has not had an Afib/aflutter ablation within the last 3 months or DCCV within the last 30 days  Per office protocol, patient can hold Xarelto for 2 days prior to procedure.    **This guidance is not considered finalized until pre-operative APP has relayed final recommendations.**

## 2024-04-05 NOTE — Telephone Encounter (Signed)
     Primary Cardiologist: Ralene Burger, MD  Clinical pharmacist have reviewed patient's chart as part of pre-operative protocol coverage.  The following recommendations have been provided for , Marie [name]   Patient with diagnosis of afib on Xarelto for anticoagulation.     Procedure:  endoscopy  Date of procedure: 04/19/24     CHA2DS2-VASc Score = 6   This indicates a 9.7% annual risk of stroke. The patient's score is based upon: CHF History: 1 HTN History: 1 Diabetes History: 1 Stroke History: 0 Vascular Disease History: 0 Age Score: 2 Gender Score: 1       CrCl 55 ml/min Platelet count 356   Patient has not had an Afib/aflutter ablation within the last 3 months or DCCV within the last 30 days   Per office protocol, patient can hold Xarelto for 2 days prior to procedure.    I will route this recommendation to the requesting party via Epic fax function and remove from pre-op pool.  Please call with questions.  Chet Cota. Cheo Selvey NP-C     04/05/2024, 3:04 PM [facility] [address] Office [phone] Fax [phone]

## 2024-04-11 ENCOUNTER — Other Ambulatory Visit: Payer: Self-pay | Admitting: Internal Medicine

## 2024-04-12 DIAGNOSIS — M25561 Pain in right knee: Secondary | ICD-10-CM | POA: Diagnosis not present

## 2024-04-14 DIAGNOSIS — R2681 Unsteadiness on feet: Secondary | ICD-10-CM | POA: Diagnosis not present

## 2024-04-14 DIAGNOSIS — R531 Weakness: Secondary | ICD-10-CM | POA: Diagnosis not present

## 2024-04-19 ENCOUNTER — Ambulatory Visit: Admitting: Podiatry

## 2024-04-21 DIAGNOSIS — K3189 Other diseases of stomach and duodenum: Secondary | ICD-10-CM | POA: Diagnosis not present

## 2024-04-21 DIAGNOSIS — K449 Diaphragmatic hernia without obstruction or gangrene: Secondary | ICD-10-CM | POA: Diagnosis not present

## 2024-04-21 DIAGNOSIS — K2930 Chronic superficial gastritis without bleeding: Secondary | ICD-10-CM | POA: Diagnosis not present

## 2024-04-21 DIAGNOSIS — K222 Esophageal obstruction: Secondary | ICD-10-CM | POA: Diagnosis not present

## 2024-04-21 DIAGNOSIS — K5710 Diverticulosis of small intestine without perforation or abscess without bleeding: Secondary | ICD-10-CM | POA: Diagnosis not present

## 2024-04-21 DIAGNOSIS — R1013 Epigastric pain: Secondary | ICD-10-CM | POA: Diagnosis not present

## 2024-04-22 ENCOUNTER — Other Ambulatory Visit: Payer: Self-pay | Admitting: Nurse Practitioner

## 2024-04-22 DIAGNOSIS — R109 Unspecified abdominal pain: Secondary | ICD-10-CM

## 2024-04-27 DIAGNOSIS — R531 Weakness: Secondary | ICD-10-CM | POA: Diagnosis not present

## 2024-04-27 DIAGNOSIS — R2681 Unsteadiness on feet: Secondary | ICD-10-CM | POA: Diagnosis not present

## 2024-04-29 ENCOUNTER — Ambulatory Visit
Admission: RE | Admit: 2024-04-29 | Discharge: 2024-04-29 | Disposition: A | Discharge status: Home / Self Care | Source: Ambulatory Visit | Attending: Nurse Practitioner | Admitting: Nurse Practitioner

## 2024-04-29 DIAGNOSIS — R109 Unspecified abdominal pain: Secondary | ICD-10-CM | POA: Diagnosis not present

## 2024-05-03 DIAGNOSIS — K2930 Chronic superficial gastritis without bleeding: Secondary | ICD-10-CM | POA: Diagnosis not present

## 2024-05-05 DIAGNOSIS — R531 Weakness: Secondary | ICD-10-CM | POA: Diagnosis not present

## 2024-05-05 DIAGNOSIS — R2681 Unsteadiness on feet: Secondary | ICD-10-CM | POA: Diagnosis not present

## 2024-05-09 ENCOUNTER — Ambulatory Visit: Admitting: Podiatry

## 2024-05-09 ENCOUNTER — Encounter: Payer: Self-pay | Admitting: Podiatry

## 2024-05-09 DIAGNOSIS — M79675 Pain in left toe(s): Secondary | ICD-10-CM

## 2024-05-09 DIAGNOSIS — M79674 Pain in right toe(s): Secondary | ICD-10-CM

## 2024-05-09 DIAGNOSIS — Z7901 Long term (current) use of anticoagulants: Secondary | ICD-10-CM

## 2024-05-09 DIAGNOSIS — E1140 Type 2 diabetes mellitus with diabetic neuropathy, unspecified: Secondary | ICD-10-CM

## 2024-05-09 DIAGNOSIS — L84 Corns and callosities: Secondary | ICD-10-CM

## 2024-05-09 DIAGNOSIS — B351 Tinea unguium: Secondary | ICD-10-CM | POA: Diagnosis not present

## 2024-05-09 NOTE — Progress Notes (Unsigned)
  Subjective:  Patient ID: Marie [name], female    DOB: [birth year],  MRN: [id number]  Chief Complaint  Patient presents with   [facility]    [facility] with one callous to the right first medial side. Last A1c was in March, 8.1, takes Xarelto    83 y.o. female presents with the above complaint. History confirmed with patient. Patient presenting with pain related to dystrophic thickened elongated nails. Patient is unable to trim own nails related to nail dystrophy and mobility issues. Patient does have a history of T2DM, last A1c 8.1.  Patient is on chronic Xarelto.  Area of prior ulceration between first 2 toes now healed.  Objective:  Physical Exam: warm, good capillary refill, diminished pedal hair growth, pedal skin atrophic nail exam onychomycosis of the toenails, onycholysis, and dystrophic nails DP pulses palpable, PT pulses palpable, and protective sensation absent Left Foot:  Pain with palpation of nails due to elongation and dystrophic growth.  Bunion deformity present Right Foot: Pain with palpation of nails due to elongation and dystrophic growth.  Right foot bunion deformity toes 1 and 2 rubbing together  Assessment:   1. Type 2 diabetes mellitus with diabetic neuropathy, unspecified whether long term insulin  use (HCC)   2. Pain due to onychomycosis of toenails of both feet   3. Callus between toes   4. Chronic anticoagulation      Plan:  Patient was evaluated and treated and all questions answered.  #Onychomycosis with pain  -Nails palliatively debrided as below. -Educated on self-care - Anticoagulated on Xarelto.  Procedure: Nail Debridement Rationale: Pain Type of Debridement: manual, sharp debridement. Instrumentation: Nail nipper, rotary burr. Number of Nails: 10  Patient educated on diabetes. Discussed proper diabetic foot care and discussed risks and complications of disease. Educated patient in depth on reasons to return to the office immediately should he/she discover  anything concerning or new on the feet. All questions answered. Discussed proper shoes as well.   Continue the use of toe spacers to prevent ulceration.  Return in about 3 months (around 08/09/2024) for Diabetic Foot Care.         Eve Hinders, DPM [facility] / [facility]

## 2024-05-11 DIAGNOSIS — R2681 Unsteadiness on feet: Secondary | ICD-10-CM | POA: Diagnosis not present

## 2024-05-11 DIAGNOSIS — R531 Weakness: Secondary | ICD-10-CM | POA: Diagnosis not present

## 2024-05-16 DIAGNOSIS — R2681 Unsteadiness on feet: Secondary | ICD-10-CM | POA: Diagnosis not present

## 2024-05-16 DIAGNOSIS — R531 Weakness: Secondary | ICD-10-CM | POA: Diagnosis not present

## 2024-05-25 ENCOUNTER — Ambulatory Visit: Admitting: Internal Medicine

## 2024-05-25 ENCOUNTER — Encounter: Payer: Self-pay | Admitting: Internal Medicine

## 2024-05-25 VITALS — BP 120/68 | HR 60 | Temp 98.0°F | Resp 18 | Ht 67.0 in | Wt 191.6 lb

## 2024-05-25 DIAGNOSIS — I10 Essential (primary) hypertension: Secondary | ICD-10-CM | POA: Diagnosis not present

## 2024-05-25 DIAGNOSIS — E7800 Pure hypercholesterolemia, unspecified: Secondary | ICD-10-CM | POA: Diagnosis not present

## 2024-05-25 DIAGNOSIS — E1121 Type 2 diabetes mellitus with diabetic nephropathy: Secondary | ICD-10-CM | POA: Diagnosis not present

## 2024-05-25 DIAGNOSIS — K219 Gastro-esophageal reflux disease without esophagitis: Secondary | ICD-10-CM

## 2024-05-25 DIAGNOSIS — N1831 Chronic kidney disease, stage 3a: Secondary | ICD-10-CM

## 2024-05-25 DIAGNOSIS — N3281 Overactive bladder: Secondary | ICD-10-CM | POA: Diagnosis not present

## 2024-05-25 DIAGNOSIS — G894 Chronic pain syndrome: Secondary | ICD-10-CM

## 2024-05-25 MED ORDER — MIRABEGRON ER 25 MG PO TB24: 25.0000 mg | ORAL_TABLET | Freq: Every day | ORAL | 11 refills | Status: AC

## 2024-05-25 MED ORDER — TRAMADOL HCL 50 MG PO TABS: 50.0000 mg | ORAL_TABLET | Freq: Three times a day (TID) | ORAL | 2 refills | Status: DC | PRN

## 2024-05-25 NOTE — Assessment & Plan Note (Signed)
 She states the tramadol  does not last long enough.  We will check her tramadol  from 50mg  q 12 hrs prn to 50mg  q 8 hrs prn.  I reviewed her [place] CSR and her PDMP.  We will give her 3 months of medications.

## 2024-05-25 NOTE — Assessment & Plan Note (Signed)
 I will obtian her notes from GI.

## 2024-05-25 NOTE — Assessment & Plan Note (Signed)
 Her BP is controlled.  We will continue to monitor and she will remain on metoprolol  and chlorathalidone.

## 2024-05-25 NOTE — Assessment & Plan Note (Signed)
 She has stopped her ozempic .  I am going to recheck her HgBa1c today.
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
## 2024-05-25 NOTE — Assessment & Plan Note (Signed)
 We will continue to control her diabetes and HTN.  She is to remain off any NSAIDS.  We cannot get her on farxiga or kerendia.

## 2024-05-25 NOTE — Assessment & Plan Note (Signed)
 She is just on atorvastatin  40mg  and not 80mg .  We will check a FLP today.

## 2024-05-26 ENCOUNTER — Ambulatory Visit: Payer: Self-pay

## 2024-05-26 LAB — CMP14 + ANION GAP
ALT: 20 IU/L (ref 0–32)
AST: 23 IU/L (ref 0–40)
Albumin: 4.2 g/dL (ref 3.7–4.7)
Alkaline Phosphatase: 72 IU/L (ref 44–121)
Anion Gap: 20 mmol/L — ABNORMAL HIGH (ref 10.0–18.0)
BUN/Creatinine Ratio: 13 (ref 12–28)
BUN: 16 mg/dL (ref 8–27)
Bilirubin Total: 0.9 mg/dL (ref 0.0–1.2)
CO2: 27 mmol/L (ref 20–29)
Calcium: 9.5 mg/dL (ref 8.7–10.3)
Chloride: 84 mmol/L — ABNORMAL LOW (ref 96–106)
Creatinine, Ser: 1.26 mg/dL — ABNORMAL HIGH (ref 0.57–1.00)
Globulin, Total: 2.2 g/dL (ref 1.5–4.5)
Glucose: 443 mg/dL — ABNORMAL HIGH (ref 70–99)
Potassium: 4 mmol/L (ref 3.5–5.2)
Sodium: 131 mmol/L — ABNORMAL LOW (ref 134–144)
Total Protein: 6.4 g/dL (ref 6.0–8.5)
eGFR: 43 mL/min/{1.73_m2} — ABNORMAL LOW (ref >59)

## 2024-05-26 LAB — HEMOGLOBIN A1C
Est. average glucose Bld gHb Est-mCnc: 272 mg/dL
Hgb A1c MFr Bld: 11.1 % — ABNORMAL HIGH (ref 4.8–5.6)

## 2024-05-26 LAB — LIPID PANEL
Chol/HDL Ratio: 3 ratio (ref 0.0–4.4)
Cholesterol, Total: 134 mg/dL (ref 100–199)
HDL: 44 mg/dL (ref >39)
LDL Chol Calc (NIH): 56 mg/dL (ref 0–99)
Triglycerides: 207 mg/dL — ABNORMAL HIGH (ref 0–149)
VLDL Cholesterol Cal: 34 mg/dL (ref 5–40)

## 2024-05-26 NOTE — Progress Notes (Signed)
 Patient called.  Unable to reach patient.  I have called and was unable to leave a voicemail. The patient needs to be informed Her diabetes is out of control.  We stopped the ozempic  due to abdominal pain.  She needs to start on lantus 10 Units daily.  She needs to check her blood sugars twice a day.  I need to see her back in clinic in 1 month for a diabetes recheck.  Please arrange this.  There is no change to her CKD and her cholesterol is controlled. Aaron Aas

## 2024-05-27 ENCOUNTER — Other Ambulatory Visit: Payer: Self-pay

## 2024-05-27 MED ORDER — INSULIN GLARGINE 100 UNIT/ML ~~LOC~~ SOLN: SUBCUTANEOUS | 3 refills | Status: DC

## 2024-05-27 NOTE — Progress Notes (Signed)
 Patient called.  Patient aware.  The patient returned our phone call and I have informed her Her diabetes is out of control.  We stopped the ozempic  due to abdominal pain.  She needs to start on lantus 10 Units daily.  She needs to check her blood sugars twice a day.  I need to see her back in clinic in 1 month for a diabetes recheck.  Please arrange this.  There is no change to her CKD and her cholesterol is controlled. Aaron Aas  Pt aware, scheduling F/U with Candace, also sent in Lantus 10 units daily to [facility] in [place].

## 2024-06-24 ENCOUNTER — Ambulatory Visit: Admitting: Internal Medicine

## 2024-06-24 ENCOUNTER — Encounter: Payer: Self-pay | Admitting: Internal Medicine

## 2024-06-24 ENCOUNTER — Other Ambulatory Visit: Payer: Self-pay | Admitting: Internal Medicine

## 2024-06-24 VITALS — BP 122/78 | HR 56 | Temp 98.2°F | Resp 18 | Ht 66.0 in | Wt 195.8 lb

## 2024-06-24 DIAGNOSIS — N1831 Chronic kidney disease, stage 3a: Secondary | ICD-10-CM | POA: Diagnosis not present

## 2024-06-24 DIAGNOSIS — E1165 Type 2 diabetes mellitus with hyperglycemia: Secondary | ICD-10-CM

## 2024-06-24 DIAGNOSIS — E1121 Type 2 diabetes mellitus with diabetic nephropathy: Secondary | ICD-10-CM | POA: Diagnosis not present

## 2024-06-24 HISTORY — DX: Type 2 diabetes mellitus with hyperglycemia: E11.65

## 2024-06-24 MED ORDER — ATORVASTATIN CALCIUM 80 MG PO TABS: 80.0000 mg | ORAL_TABLET | Freq: Every day | ORAL | 3 refills | Status: AC

## 2024-06-24 NOTE — Assessment & Plan Note (Signed)
 Plan as above.
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
## 2024-06-24 NOTE — Assessment & Plan Note (Signed)
 I have asked her to increase her lantus  to 16 Units daily.  She is to increase lantus  by 3 Units every 3 days until her FSBS consistently <150.

## 2024-06-27 ENCOUNTER — Other Ambulatory Visit: Payer: Self-pay | Admitting: Internal Medicine

## 2024-07-05 ENCOUNTER — Encounter: Payer: Self-pay | Admitting: Cardiology

## 2024-07-05 ENCOUNTER — Ambulatory Visit: Attending: Cardiology | Admitting: Cardiology

## 2024-07-05 VITALS — BP 134/70 | HR 55 | Ht 66.6 in | Wt 196.4 lb

## 2024-07-05 DIAGNOSIS — I480 Paroxysmal atrial fibrillation: Secondary | ICD-10-CM

## 2024-07-05 DIAGNOSIS — E7800 Pure hypercholesterolemia, unspecified: Secondary | ICD-10-CM

## 2024-07-05 DIAGNOSIS — I10 Essential (primary) hypertension: Secondary | ICD-10-CM | POA: Diagnosis not present

## 2024-07-05 DIAGNOSIS — I2510 Atherosclerotic heart disease of native coronary artery without angina pectoris: Secondary | ICD-10-CM | POA: Diagnosis not present

## 2024-07-05 DIAGNOSIS — E1121 Type 2 diabetes mellitus with diabetic nephropathy: Secondary | ICD-10-CM

## 2024-07-05 NOTE — Patient Instructions (Signed)
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
## 2024-07-18 ENCOUNTER — Other Ambulatory Visit: Payer: Self-pay | Admitting: Internal Medicine

## 2024-08-01 ENCOUNTER — Other Ambulatory Visit: Payer: Self-pay | Admitting: Internal Medicine

## 2024-08-09 ENCOUNTER — Ambulatory Visit: Admitting: Podiatry

## 2024-08-09 ENCOUNTER — Encounter: Payer: Self-pay | Admitting: Podiatry

## 2024-08-09 DIAGNOSIS — M79675 Pain in left toe(s): Secondary | ICD-10-CM

## 2024-08-09 DIAGNOSIS — M79674 Pain in right toe(s): Secondary | ICD-10-CM | POA: Diagnosis not present

## 2024-08-09 DIAGNOSIS — B351 Tinea unguium: Secondary | ICD-10-CM | POA: Diagnosis not present

## 2024-08-09 DIAGNOSIS — L84 Corns and callosities: Secondary | ICD-10-CM | POA: Diagnosis not present

## 2024-08-09 DIAGNOSIS — E1140 Type 2 diabetes mellitus with diabetic neuropathy, unspecified: Secondary | ICD-10-CM

## 2024-08-09 NOTE — Progress Notes (Unsigned)
  Subjective:  Patient ID: Marie [name], female    DOB: [birth year],  MRN: [id number]  Chief Complaint  Patient presents with   [facility]    [facility] with one callous on the right hallux A1c 11.1 in [place] Xarelto    83 y.o. female presents with the above complaint. History confirmed with patient. Patient presenting with pain related to dystrophic thickened elongated nails. Patient is unable to trim own nails related to nail dystrophy and mobility issues. Patient does have a history of T2DM, last A1c 11.1, is elevated, recently stopped Ozempic .  Patient is on chronic Xarelto.  Complaining of painful hyperkeratotic fissure right first toe.  Objective:  Physical Exam: warm, good capillary refill, diminished pedal hair growth, pedal skin atrophic nail exam onychomycosis of the toenails, onycholysis, and dystrophic nails DP pulses palpable, PT pulses palpable, and protective sensation absent Left Foot:  Pain with palpation of nails due to elongation and dystrophic growth.  Bunion deformity present Right Foot: Pain with palpation of nails due to elongation and dystrophic growth.  Right foot bunion deformity toes 1 and 2 rubbing together.  Hyperkeratotic preulcerative fissure present right first toe medial aspect proximal phalanx level.  Assessment:   1. Type 2 diabetes mellitus with diabetic neuropathy, unspecified whether long term insulin  use (HCC)   2. Pain due to onychomycosis of toenails of both feet   3. Callus of foot      Plan:  Patient was evaluated and treated and all questions answered.  # Preulcerative hyperkeratotic fissure right first toe proximal phalanx level, plantar medial aspect All symptomatic hyperkeratoses x1 were safely debrided with a sterile #15 blade to patient's level of comfort without incident. We discussed preventative and palliative care of these lesions including supportive and accommodative shoegear, padding, prefabricated and custom molded accommodative orthoses, use of  a pumice stone and lotions/creams daily.   #Onychomycosis with pain  -Nails palliatively debrided as below. -Educated on self-care - Chronically anticoagulated on Xarelto  Procedure: Nail Debridement Rationale: Pain Type of Debridement: manual, sharp debridement. Instrumentation: Nail nipper, rotary burr. Number of Nails: 10   Patient educated on diabetes. Discussed proper diabetic foot care and discussed risks and complications of disease. Educated patient in depth on reasons to return to the office immediately should he/she discover anything concerning or new on the feet. All questions answered. Discussed proper shoes as well.   Continue the use of toe spacers to prevent ulceration.  Return in about 3 months (around 11/08/2024) for Diabetic Foot Care.         Ethan Saddler, DPM [facility] / [facility]

## 2024-08-10 ENCOUNTER — Other Ambulatory Visit: Payer: Self-pay | Admitting: Cardiology

## 2024-08-11 ENCOUNTER — Other Ambulatory Visit: Payer: Self-pay | Admitting: Internal Medicine

## 2024-08-24 ENCOUNTER — Ambulatory Visit: Admitting: Internal Medicine

## 2024-08-29 ENCOUNTER — Other Ambulatory Visit: Payer: Self-pay | Admitting: Internal Medicine

## 2024-08-31 ENCOUNTER — Ambulatory Visit: Admitting: Internal Medicine

## 2024-09-02 ENCOUNTER — Ambulatory Visit: Admitting: Internal Medicine

## 2024-09-02 ENCOUNTER — Encounter: Payer: Self-pay | Admitting: Internal Medicine

## 2024-09-02 VITALS — BP 160/68 | HR 58 | Temp 98.2°F | Resp 18 | Ht 66.0 in | Wt 201.8 lb

## 2024-09-02 DIAGNOSIS — N1831 Chronic kidney disease, stage 3a: Secondary | ICD-10-CM

## 2024-09-02 DIAGNOSIS — E1121 Type 2 diabetes mellitus with diabetic nephropathy: Secondary | ICD-10-CM | POA: Diagnosis not present

## 2024-09-02 DIAGNOSIS — Z23 Encounter for immunization: Secondary | ICD-10-CM | POA: Diagnosis not present

## 2024-09-02 DIAGNOSIS — I2510 Atherosclerotic heart disease of native coronary artery without angina pectoris: Secondary | ICD-10-CM
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
## 2024-09-02 NOTE — Assessment & Plan Note (Signed)
 She is to control her diabetes and HTN.  She is to avoid NSAIDS at this time.

## 2024-09-02 NOTE — Assessment & Plan Note (Signed)
 She eats at 8AM, 1PM and again at [facility].  She check her blood sugars before she eats at 8AM and again at 4PM.  She is still checking a postprandial sugar when she eats lunch at 1 and then checks her FSBS 3 hours later.  I am going to continue her current lantus  21 units and her actos  30mg  daily and recheck her HgbA1c today.

## 2024-09-02 NOTE — Addendum Note (Signed)
 Addended by: LENETTA LACKS on: 09/02/2024 04:27 PM   Modules accepted: Orders

## 2024-09-02 NOTE — Assessment & Plan Note (Signed)
 She denies any angina.  We will continue on risk factor modifcation.  She is not on ASA but xarelto for her history of A. Fib.  Continue on atorvastatin  and her last LDL was controlled.

## 2024-09-03 LAB — HEMOGLOBIN A1C
Est. average glucose Bld gHb Est-mCnc: 171 mg/dL
Hgb A1c MFr Bld: 7.6 % — ABNORMAL HIGH (ref 4.8–5.6)

## 2024-09-12 ENCOUNTER — Ambulatory Visit: Payer: Self-pay

## 2024-09-12 NOTE — Progress Notes (Signed)
 Patient called.  Patient aware.

## 2024-09-15 ENCOUNTER — Other Ambulatory Visit: Payer: Self-pay

## 2024-09-15 MED ORDER — GLUCOSE BLOOD VI STRP: ORAL_STRIP | 12 refills | Status: AC

## 2024-09-15 NOTE — Progress Notes (Signed)
 Accucheck and syringes sent to  [facility] in [place]

## 2024-09-16 ENCOUNTER — Other Ambulatory Visit: Payer: Self-pay | Admitting: Internal Medicine

## 2024-10-12 ENCOUNTER — Other Ambulatory Visit: Payer: Self-pay | Admitting: Internal Medicine

## 2024-10-14 ENCOUNTER — Ambulatory Visit: Admitting: Internal Medicine

## 2024-10-14 ENCOUNTER — Encounter: Payer: Self-pay | Admitting: Internal Medicine

## 2024-10-14 VITALS — BP 124/84 | HR 65 | Temp 97.9°F | Resp 18 | Ht 66.0 in | Wt 205.5 lb

## 2024-10-14 DIAGNOSIS — G894 Chronic pain syndrome: Secondary | ICD-10-CM

## 2024-10-14 DIAGNOSIS — M25552 Pain in left hip: Secondary | ICD-10-CM | POA: Diagnosis not present

## 2024-10-14 MED ORDER — HYDROCODONE-ACETAMINOPHEN 5-325 MG PO TABS: 1.0000 | ORAL_TABLET | Freq: Two times a day (BID) | ORAL | 0 refills | Status: DC | PRN
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
## 2024-10-14 NOTE — Assessment & Plan Note (Signed)
 She goes to see ortho on Tuesday who will be doing xrays on her.  She probably has DJD and they will assess her at that time.  I am going to change her tramadol  to hydrocodone -APAP 5/325mg  po q 12 hrs prn.  This pain is effecting her gait where she is now using a walker at home and the pain is keeping her up at night.  I did reviewed her [place] CSR/PDMP.

## 2024-10-24 ENCOUNTER — Other Ambulatory Visit: Payer: Self-pay | Admitting: Internal Medicine

## 2024-10-25 ENCOUNTER — Other Ambulatory Visit: Payer: Self-pay | Admitting: Internal Medicine

## 2024-11-08 ENCOUNTER — Encounter: Payer: Self-pay | Admitting: Podiatry

## 2024-11-08 ENCOUNTER — Ambulatory Visit: Admitting: Podiatry

## 2024-11-08 DIAGNOSIS — B351 Tinea unguium: Secondary | ICD-10-CM | POA: Diagnosis not present

## 2024-11-08 DIAGNOSIS — M79674 Pain in right toe(s): Secondary | ICD-10-CM | POA: Diagnosis not present

## 2024-11-08 DIAGNOSIS — M79675 Pain in left toe(s): Secondary | ICD-10-CM | POA: Diagnosis not present

## 2024-11-08 DIAGNOSIS — E1140 Type 2 diabetes mellitus with diabetic neuropathy, unspecified: Secondary | ICD-10-CM

## 2024-11-08 NOTE — Progress Notes (Unsigned)
  Subjective:  Patient ID: Marie [name], female    DOB: 01/30/1941,  MRN: [id number]  Chief Complaint  Patient presents with   [facility]    [facility] with out callous  A1c 7.6 in Nov Xarelto    83 y.o. female presents with the above complaint. History confirmed with patient. Patient presenting with pain related to dystrophic thickened elongated nails. Patient is unable to trim own nails related to nail dystrophy and mobility issues. Patient does have a history of T2DM, last A1c 7.6 down from 11 from previous. Patient is on chronic Xarelto.  No significant callus formation today.  Objective:  Physical Exam: warm, good capillary refill, diminished pedal hair growth, pedal skin atrophic nail exam onychomycosis of the toenails, onycholysis, and dystrophic nails DP pulses palpable, PT pulses palpable, and protective sensation absent Left Foot:  Pain with palpation of nails due to elongation and dystrophic growth.  Bunion deformity present Right Foot: Pain with palpation of nails due to elongation and dystrophic growth.  Right foot bunion deformity toes 1 and 2 rubbing together.  No significant callus formation noted today.  Assessment:   1. Type 2 diabetes mellitus with diabetic neuropathy, unspecified whether long term insulin  use (HCC)   2. Pain due to onychomycosis of toenails of both feet      Plan:  Patient was evaluated and treated and all questions answered.   #Onychomycosis with pain  -Nails palliatively debrided as below. -Educated on self-care - Chronically anticoagulated on Xarelto  Procedure: Nail Debridement Rationale: Pain Type of Debridement: manual, sharp debridement. Instrumentation: Nail nipper, rotary burr. Number of Nails: 10   Patient educated on diabetes. Discussed proper diabetic foot care and discussed risks and complications of disease. Educated patient in depth on reasons to return to the office immediately should he/she discover anything concerning or new on the  feet. All questions answered. Discussed proper shoes as well.   Continue the use of toe spacers to prevent ulceration and irritation between toes.  Return in about 3 months (around 02/06/2025) for Diabetic Foot Care.         Ethan Saddler, DPM [facility] / [facility]

## 2024-11-14 ENCOUNTER — Ambulatory Visit: Admitting: Internal Medicine

## 2024-11-14 ENCOUNTER — Other Ambulatory Visit: Payer: Self-pay | Admitting: Internal Medicine

## 2024-11-14 ENCOUNTER — Encounter: Payer: Self-pay | Admitting: Internal Medicine

## 2024-11-14 VITALS — BP 172/64 | HR 51 | Temp 98.0°F | Resp 16 | Ht 66.0 in | Wt 202.4 lb

## 2024-11-14 DIAGNOSIS — G894 Chronic pain syndrome: Secondary | ICD-10-CM

## 2024-11-14 MED ORDER — FUROSEMIDE 20 MG PO TABS: 20.0000 mg | ORAL_TABLET | Freq: Every day | ORAL | 0 refills | Status: AC | PRN

## 2024-11-14 MED ORDER — PANTOPRAZOLE SODIUM 40 MG PO TBEC: 40.0000 mg | DELAYED_RELEASE_TABLET | Freq: Two times a day (BID) | ORAL | 1 refills | Status: AC

## 2024-11-14 MED ORDER — GABAPENTIN 100 MG PO CAPS: ORAL_CAPSULE | ORAL | 1 refills | Status: AC

## 2024-11-14 MED ORDER — TRAMADOL HCL 50 MG PO TABS: 50.0000 mg | ORAL_TABLET | Freq: Three times a day (TID) | ORAL | 2 refills | Status: AC | PRN

## 2024-11-14 MED ORDER — METOPROLOL TARTRATE 50 MG PO TABS: 50.0000 mg | ORAL_TABLET | Freq: Two times a day (BID) | ORAL | 3 refills | Status: AC

## 2024-11-14 NOTE — Assessment & Plan Note (Signed)
 We will switch her back from hydrocodone -APAP to tramadol  50mg  po q 8 hours prn.  She will followup with [facility] as directed.  I reviewed her [facility] CSR/PDMP.
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
## 2024-12-15 ENCOUNTER — Ambulatory Visit: Admitting: Internal Medicine

## 2024-12-23 ENCOUNTER — Ambulatory Visit

## 2024-12-23 VITALS — BP 150/74 | HR 55 | Temp 98.5°F | Resp 16 | Ht 66.5 in | Wt 202.6 lb

## 2024-12-23 DIAGNOSIS — E1121 Type 2 diabetes mellitus with diabetic nephropathy: Secondary | ICD-10-CM

## 2024-12-23 NOTE — Progress Notes (Signed)
 Patient: Marie [name] DOB: 08-05-1941 (84 year old female) Date of Visit: 12/23/2024  S - Subjective Patient presents today for her annual wellness visit accompanied by her husband. History and Review of Systems: General: Denies intentional weight loss, fatigue, or constitutional symptoms. Pain: Reports bilateral knee pain (pain score 6/10), stiffness; received steroid knee injection three weeks ago. Chronic pain otherwise managed with Tramadol . Falls: Two falls in past 12 months, no injuries reported. Mobility: Ambulates with walker at home; requires assistance from husband. Cardiovascular: Denies palpitations, orthopnea, chest pain. Respiratory: Denies cough, dyspnea, wheezing. Gastrointestinal: Denies nausea, vomiting, diarrhea, abdominal pain. Continues GERD symptoms managed with Protonix . Genitourinary: Reports chronic urinary leakage, not new onset; denies dysuria, frequency, urgency changes. Unable to complete urine micro today; will complete at next visit. Endocrine/Metabolic: Diagnosed with Type 2 Diabetes Mellitus, adheres intermittently to diabetic diet.  Medications: Reviewed Vaccinations: Up-to-date on tetanus, COVID; received influenza vaccine last year; declines shingles vaccine today. Lifestyle: Does not follow structured exercise program; diet adherence intermittent. Denies tobacco or alcohol use. Dental: Does notreceive current dental care. Vision: Annual diabetic eye exam scheduled 01/02/25. Cognition: Mini-Mental State Exam 29/30. Mood: Denies depression or anxiety.  O - Objective Vital Signs: BP: 150/74 mmHg Pulse: 55 bpm Temp: 98.33F Resp: 16/min O2 sat: 96% RA Weight: 202 lb 9 oz Height: 5'6 BMI: 32.6 kg/m General: Well-appearing, no acute distress. HEENT: Head: Normocephalic, atraumatic Eyes: Conjunctiva normal, pupils equal, round, reactive to light Ears: Tympanic membranes intact, canals clear Nose: No congestion/rhinorrhea Mouth: Mucous membranes  moist Neck: Thyroid  normal, no nodules, no carotid bruits Cardiovascular: Regular rate and rhythm, S1/S2 normal, no murmurs/gallops/rubs Respiratory: Normal effort, lungs clear, no wheezing/rhonchi/rales Abdomen: Soft, non-tender, non-distended, normal bowel sounds Musculoskeletal: Normal tone, strength intact; limited ROM in knees due to osteoarthritis; ambulates with walker; depends on husband for support Neurological: Alert and oriented 3, cranial nerves intact, reflexes symmetric, sensation intact, capillary refill <2 sec Skin: Warm, dry, intact, no rashes or lesions Psychiatric: Mood and affect normal, behavior appropriate Diabetic Foot Exam: Normal  A - Assessment Diabetic Nephropathy Associated with Type 2 Diabetes Mellitus - A1C 9.26%, goal <7%; on Actos  30 mg daily and Lantus  21 units SQ daily; diabetic foot exam normal. Stage 3a Chronic Kidney Disease (CKD) - Avoid NSAIDs; continue current medication management. Chronic Pain Syndrome / Osteoarthritis (knees) - Ongoing knee pain; recent steroid injection; Tramadol  continued. Essential Hypertension - BP 150/74 mmHg. Hypercholesterolemia - Continue current management. Coronary Artery Disease - Native coronary artery, no angina. Paroxysmal Atrial Fibrillation - Continue current monitoring. Osteopenia - History of DEXA; continue preventive measures. GERD - Continue Protonix .  P - Plan Diabetes / Endocrine: Continue Actos  30 mg daily and Lantus  21 units SQ daily Monitor A1C at next visit (post-knee steroid injection) Reinforce diabetic diet adherence and lifestyle modifications Chronic Pain / Osteoarthritis: Continue Tramadol  as previously ordered Monitor knee pain; follow-up as needed for injections or referrals Encourage gentle home mobility/exercise as tolerated Hypertension / Cardiovascular: Monitor BP at home and follow-up at routine visits Encourage lifestyle modifications (low sodium diet, activity as  tolerated) GERD: Continue Protonix  as prescribed Avoid NSAIDs due to CKD Preventive Health: Flu vaccine up-to-date Shingles vaccine deferred today Tetanus and COVID vaccines up-to-date Dental care recommended; encourage scheduling Vision exam scheduled 01/02/25 Fall risk assessment reviewed; patient has fallen twice in past 12 months, home safety counseling provided Labs / Studies: Urine microalbumin not completed today; will repeat at next visit Education: Discussed chronic disease management, medication adherence, fall prevention, and lifestyle measures Reviewed  red flag symptoms (worsening edema, chest pain, shortness of breath, hypoglycemia, or uncontrolled pain) Follow-up: Annual wellness visit: completed today Routine follow-up 3 months Urine microalbumin and repeat A1C at next visit  Jon Bihari, NP

## 2025-02-06 ENCOUNTER — Ambulatory Visit: Admitting: Podiatry

## 2025-03-22 ENCOUNTER — Ambulatory Visit
